# Patient Record
Sex: Female | Born: 1990 | Race: Black or African American | Hispanic: No | Marital: Single | Smoking: Never smoker
Health system: Southern US, Community
[De-identification: ages and names within clinical notes are randomized; demographics above are authoritative.]

## PROBLEM LIST (undated history)

## (undated) DIAGNOSIS — E282 Polycystic ovarian syndrome: Secondary | ICD-10-CM

## (undated) DIAGNOSIS — F32A Depression, unspecified: Secondary | ICD-10-CM

## (undated) DIAGNOSIS — F329 Major depressive disorder, single episode, unspecified: Secondary | ICD-10-CM

## (undated) HISTORY — DX: Depression, unspecified: F32.A

## (undated) HISTORY — DX: Polycystic ovarian syndrome: E28.2

---
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
## 2013-03-29 ENCOUNTER — Ambulatory Visit (INDEPENDENT_AMBULATORY_CARE_PROVIDER_SITE_OTHER): Payer: BC Managed Care – PPO | Admitting: Emergency Medicine

## 2013-03-29 VITALS — BP 108/62 | HR 116 | Temp 99.4°F | Resp 18 | Ht 61.0 in | Wt 165.0 lb

## 2013-03-29 DIAGNOSIS — M940 Chondrocostal junction syndrome [Tietze]: Secondary | ICD-10-CM

## 2013-03-29 MED ORDER — TRAMADOL HCL 50 MG PO TABS: 50.0000 mg | ORAL_TABLET | Freq: Three times a day (TID) | ORAL | Status: DC | PRN

## 2013-03-29 MED ORDER — NAPROXEN SODIUM 550 MG PO TABS: 550.0000 mg | ORAL_TABLET | Freq: Two times a day (BID) | ORAL | Status: AC

## 2013-03-29 NOTE — Progress Notes (Signed)
[facility] [address], [place] [address] [id number]  Date:  03/29/2013   Name:  Leslie Bell   DOB:  1990/06/01   MRN:  [id number]  PCP:  No primary provider on file.    Chief Complaint: pain under breast x2 hrs   History of Present Illness:  Leslie Bell is a 22 y.o. very pleasant female patient who presents with the following:  Ho history of injury.  Has sudden onset of sharp pain in lower left sternal region worse with deep breathing or movement.  No PE / DVT risks, no cough or coryza, waterbrash, no nausea or vomiting, no wheezing or shortness of breath.  No antecedent illness or overuse.  History of prior costochondritis..  No improvement with over the counter medications or other home remedies. Denies other complaint or health concern today.   There are no active problems to display for this patient.   History reviewed. No pertinent past medical history.  History reviewed. No pertinent past surgical history.  History  Substance Use Topics  . Smoking status: Never Smoker   . Smokeless tobacco: Not on file  . Alcohol Use: Not on file    Family History  Problem Relation Age of Onset  . Hypertension Mother   . Diabetes Maternal Grandmother   . Stroke Maternal Grandfather     No Known Allergies  Medication list has been reviewed and updated.  No current outpatient prescriptions on file prior to visit.   No current facility-administered medications on file prior to visit.    Review of Systems:  As per HPI, otherwise negative.    Physical Examination: Filed Vitals:   03/29/13 1646  BP: 108/62  Pulse: 116  Temp: 99.4 F (37.4 C)  Resp: 18   Filed Vitals:   03/29/13 1646  Height: 5\' 1"  (1.549 m)  Weight: 165 lb (74.844 kg)   Body mass index is 31.19 kg/(m^2). Ideal Body Weight: Weight in (lb) to have BMI = 25: 132  GEN: WDWN, NAD, Non-toxic, A & O x 3 HEENT: Atraumatic, Normocephalic. Neck supple. No masses, No  LAD. Ears and Nose: No external deformity. CV: RRR, No M/G/R. No JVD. No thrill. No extra heart sounds. PULM: CTA B, no wheezes, crackles, rhonchi. No retractions. No resp. distress. No accessory muscle use. Chest:  Left sternal border marked point tenderness inferior two ribs on left. ABD: S, NT, ND, +BS. No rebound. No HSM. EXTR: No c/c/e NEURO Normal gait.  PSYCH: Normally interactive. Conversant. Not depressed or anxious appearing.  Calm demeanor.    Assessment and Plan: Costochondritis Anaprox Tramadol   Signed,  Phillips Odor, MD

## 2013-03-29 NOTE — Patient Instructions (Signed)
costCostochondritis Costochondritis (Tietze syndrome), or costochondral separation, is a swelling and irritation (inflammation) of the tissue (cartilage) that connects your ribs with your breastbone (sternum). It may occur on its own (spontaneously), through damage caused by an accident (trauma), or simply from coughing or minor exercise. It may take up to 6 weeks to get better and longer if you are unable to be conservative in your activities. HOME CARE INSTRUCTIONS   Avoid exhausting physical activity. Try not to strain your ribs during normal activity. This would include any activities using chest, belly (abdominal), and side muscles, especially if heavy weights are used.  Use ice for 15-20 minutes per hour while awake for the first 2 days. Place the ice in a plastic bag, and place a towel between the bag of ice and your skin.  Only take over-the-counter or prescription medicines for pain, discomfort, or fever as directed by your caregiver. SEEK IMMEDIATE MEDICAL CARE IF:   Your pain increases or you are very uncomfortable.  You have a fever.  You develop difficulty with your breathing.  You cough up blood.  You develop worse chest pains, shortness of breath, sweating, or vomiting.  You develop new, unexplained problems (symptoms). MAKE SURE YOU:   Understand these instructions.  Will watch your condition.  Will get help right away if you are not doing well or get worse. Document Released: 02/02/2005 Document Revised: 07/18/2011 Document Reviewed: 11/27/2012 [facility] Patient Information 2014 [place], [place].

## 2018-01-03 ENCOUNTER — Other Ambulatory Visit: Payer: Self-pay | Admitting: Obstetrics and Gynecology

## 2018-01-03 ENCOUNTER — Other Ambulatory Visit (HOSPITAL_COMMUNITY)
Admission: RE | Admit: 2018-01-03 | Discharge: 2018-01-03 | Disposition: A | Discharge status: Home / Self Care | Payer: Managed Care, Other (non HMO) | Source: Ambulatory Visit | Attending: Obstetrics and Gynecology | Admitting: Obstetrics and Gynecology

## 2018-01-03 DIAGNOSIS — Z01419 Encounter for gynecological examination (general) (routine) without abnormal findings: Secondary | ICD-10-CM | POA: Insufficient documentation

## 2018-01-09 LAB — CYTOLOGY - PAP: Diagnosis: NEGATIVE

## 2018-02-27 DIAGNOSIS — E559 Vitamin D deficiency, unspecified: Secondary | ICD-10-CM | POA: Diagnosis not present

## 2018-02-27 DIAGNOSIS — Z0000 Encounter for general adult medical examination without abnormal findings: Secondary | ICD-10-CM | POA: Diagnosis not present

## 2018-03-01 DIAGNOSIS — N898 Other specified noninflammatory disorders of vagina: Secondary | ICD-10-CM | POA: Diagnosis not present

## 2018-03-01 DIAGNOSIS — Z3009 Encounter for other general counseling and advice on contraception: Secondary | ICD-10-CM | POA: Diagnosis not present

## 2018-03-01 DIAGNOSIS — Z113 Encounter for screening for infections with a predominantly sexual mode of transmission: Secondary | ICD-10-CM | POA: Diagnosis not present

## 2018-04-04 DIAGNOSIS — N926 Irregular menstruation, unspecified: Secondary | ICD-10-CM | POA: Diagnosis not present

## 2018-04-04 DIAGNOSIS — Z7251 High risk heterosexual behavior: Secondary | ICD-10-CM | POA: Diagnosis not present

## 2018-04-04 DIAGNOSIS — N3000 Acute cystitis without hematuria: Secondary | ICD-10-CM | POA: Diagnosis not present

## 2018-04-24 ENCOUNTER — Other Ambulatory Visit (HOSPITAL_COMMUNITY): Payer: Self-pay | Admitting: General Surgery

## 2018-05-04 ENCOUNTER — Ambulatory Visit (HOSPITAL_COMMUNITY): Payer: BLUE CROSS/BLUE SHIELD

## 2018-05-04 ENCOUNTER — Encounter (HOSPITAL_COMMUNITY): Payer: Self-pay

## 2018-05-04 ENCOUNTER — Ambulatory Visit (HOSPITAL_COMMUNITY): Admission: RE | Admit: 2018-05-04 | Payer: BLUE CROSS/BLUE SHIELD | Source: Ambulatory Visit

## 2018-05-04 ENCOUNTER — Inpatient Hospital Stay (HOSPITAL_COMMUNITY): Admission: RE | Admit: 2018-05-04 | Payer: BLUE CROSS/BLUE SHIELD | Source: Ambulatory Visit

## 2018-05-14 ENCOUNTER — Ambulatory Visit: Payer: BLUE CROSS/BLUE SHIELD | Admitting: Skilled Nursing Facility1

## 2018-05-23 ENCOUNTER — Other Ambulatory Visit (HOSPITAL_COMMUNITY): Payer: BLUE CROSS/BLUE SHIELD

## 2018-05-23 ENCOUNTER — Ambulatory Visit (HOSPITAL_COMMUNITY): Admission: RE | Admit: 2018-05-23 | Payer: BLUE CROSS/BLUE SHIELD | Source: Ambulatory Visit

## 2018-05-28 ENCOUNTER — Ambulatory Visit: Payer: BLUE CROSS/BLUE SHIELD | Admitting: Skilled Nursing Facility1

## 2018-06-04 DIAGNOSIS — F330 Major depressive disorder, recurrent, mild: Secondary | ICD-10-CM | POA: Diagnosis not present

## 2018-06-04 DIAGNOSIS — E559 Vitamin D deficiency, unspecified: Secondary | ICD-10-CM | POA: Diagnosis not present

## 2018-06-04 DIAGNOSIS — Z202 Contact with and (suspected) exposure to infections with a predominantly sexual mode of transmission: Secondary | ICD-10-CM | POA: Diagnosis not present

## 2018-06-04 DIAGNOSIS — E782 Mixed hyperlipidemia: Secondary | ICD-10-CM | POA: Diagnosis not present

## 2018-06-04 DIAGNOSIS — R5383 Other fatigue: Secondary | ICD-10-CM | POA: Diagnosis not present

## 2018-06-12 DIAGNOSIS — E782 Mixed hyperlipidemia: Secondary | ICD-10-CM | POA: Diagnosis not present

## 2018-07-25 DIAGNOSIS — Z3041 Encounter for surveillance of contraceptive pills: Secondary | ICD-10-CM | POA: Diagnosis not present

## 2018-07-25 DIAGNOSIS — E282 Polycystic ovarian syndrome: Secondary | ICD-10-CM | POA: Diagnosis not present

## 2018-07-25 DIAGNOSIS — N921 Excessive and frequent menstruation with irregular cycle: Secondary | ICD-10-CM | POA: Diagnosis not present

## 2019-03-01 ENCOUNTER — Telehealth: Payer: Self-pay | Admitting: Nurse Practitioner

## 2019-03-01 NOTE — Telephone Encounter (Signed)

## 2019-03-04 ENCOUNTER — Other Ambulatory Visit: Payer: Self-pay

## 2019-03-04 ENCOUNTER — Ambulatory Visit (INDEPENDENT_AMBULATORY_CARE_PROVIDER_SITE_OTHER): Payer: 59 | Admitting: Nurse Practitioner

## 2019-03-04 ENCOUNTER — Encounter: Payer: Self-pay | Admitting: Nurse Practitioner

## 2019-03-04 VITALS — BP 116/86 | HR 92 | Temp 98.0°F | Ht 61.0 in | Wt 236.2 lb

## 2019-03-04 DIAGNOSIS — Z8742 Personal history of other diseases of the female genital tract: Secondary | ICD-10-CM

## 2019-03-04 DIAGNOSIS — Z8659 Personal history of other mental and behavioral disorders: Secondary | ICD-10-CM | POA: Diagnosis not present

## 2019-03-04 DIAGNOSIS — Z8639 Personal history of other endocrine, nutritional and metabolic disease: Secondary | ICD-10-CM

## 2019-03-04 DIAGNOSIS — R7303 Prediabetes: Secondary | ICD-10-CM | POA: Diagnosis not present

## 2019-03-04 LAB — CBC
HCT: 40.3 % (ref 36.0–46.0)
Hemoglobin: 13.1 g/dL (ref 12.0–15.0)
MCHC: 32.6 g/dL (ref 30.0–36.0)
MCV: 88.9 fl (ref 78.0–100.0)
Platelets: 429 10*3/uL — ABNORMAL HIGH (ref 150.0–400.0)
RBC: 4.53 Mil/uL (ref 3.87–5.11)
RDW: 13.5 % (ref 11.5–15.5)
WBC: 9.7 10*3/uL (ref 4.0–10.5)

## 2019-03-04 LAB — COMPREHENSIVE METABOLIC PANEL
ALT: 18 U/L (ref 0–35)
AST: 21 U/L (ref 0–37)
Albumin: 4.3 g/dL (ref 3.5–5.2)
Alkaline Phosphatase: 56 U/L (ref 39–117)
BUN: 9 mg/dL (ref 6–23)
CO2: 25 mEq/L (ref 19–32)
Calcium: 9.5 mg/dL (ref 8.4–10.5)
Chloride: 104 mEq/L (ref 96–112)
Creatinine, Ser: 0.66 mg/dL (ref 0.40–1.20)
GFR: 131.11 mL/min (ref >60.00)
Glucose, Bld: 79 mg/dL (ref 70–99)
Potassium: 3.8 mEq/L (ref 3.5–5.1)
Sodium: 139 mEq/L (ref 135–145)
Total Bilirubin: 0.3 mg/dL (ref 0.2–1.2)
Total Protein: 7.6 g/dL (ref 6.0–8.3)

## 2019-03-04 LAB — TSH: TSH: 1.63 u[IU]/mL (ref 0.35–4.50)

## 2019-03-04 LAB — HEMOGLOBIN A1C: Hgb A1c MFr Bld: 6.2 % (ref 4.6–6.5)

## 2019-03-04 MED ORDER — VENLAFAXINE HCL 75 MG PO TABS: 75.0000 mg | ORAL_TABLET | Freq: Two times a day (BID) | ORAL | 5 refills | Status: DC

## 2019-03-04 NOTE — Patient Instructions (Addendum)
.  Normal CMP, TSH, cbc HgbA1c indicates prediabetes at 6.2 Continue metformin dose. F/up in 71months  Sign medical release to get records from previous pcp and GYN in [place], and [place].

## 2019-03-04 NOTE — Progress Notes (Signed)
Subjective:  Patient ID: Leslie Bell, female    DOB: July 05, 1990  Age: 28 y.o. MRN: [id number]  CC: Establish Care (est care--effexor and metformin 100 mg and 500 mg daily)  Anxiety Presents for initial visit. Onset was 1 to 5 years ago. The problem has been gradually improving. Symptoms include insomnia. Patient reports no decreased concentration, depressed mood, excessive worry, hyperventilation, impotence, irritability, malaise, muscle tension, nausea, nervous/anxious behavior, panic, restlessness, shortness of breath or suicidal ideas. Symptoms occur occasionally. The severity of symptoms is mild. The symptoms are aggravated by social activities. The quality of sleep is fair.   There are no known risk factors.  Her past medical history is significant for anxiety/panic attacks and depression. There is no history of anemia, asthma, bipolar disorder, fibromyalgia, hyperthyroidism or suicide attempts. Past treatments include non-SSRI antidepressants. The treatment provided significant relief. Compliance with prior treatments has been good.   Reports hx of PCOS and metabolic Syndrome: Diagnosed 77yrs ago. Current use of OCP and metformin. Last pelvic US done by GYN.  Reviewed past Medical, Social and Family history today.  Outpatient Medications Prior to Visit  Medication Sig Dispense Refill  . Levonorgest-Eth Estrad 91-Day (CAMRESE PO) Take by mouth.    . metFORMIN (GLUCOPHAGE) 1000 MG tablet Take 1,000 mg by mouth 2 (two) times daily with a meal.    . metFORMIN (GLUCOPHAGE) 500 MG tablet Take by mouth 2 (two) times daily with a meal.    . venlafaxine (EFFEXOR) 75 MG tablet Take 75 mg by mouth 2 (two) times daily.    . norgestrel-ethinyl estradiol (LO/OVRAL,CRYSELLE) 0.3-30 MG-MCG tablet Take 1 tablet by mouth daily.    . traMADol (ULTRAM) 50 MG tablet Take 1 tablet (50 mg total) by mouth every 8 (eight) hours as needed. (Patient not taking: Reported on 03/04/2019) 30 tablet 0   No  facility-administered medications prior to visit.     ROS See HPI  Objective:  BP 116/86   Pulse 92   Temp 98 F (36.7 C) (Tympanic)   Ht 5\' 1"  (1.549 m)   Wt 236 lb 3.2 oz (107.1 kg)   SpO2 98%   BMI 44.63 kg/m   BP Readings from Last 3 Encounters:  03/04/19 116/86  03/29/13 108/62    Wt Readings from Last 3 Encounters:  03/04/19 236 lb 3.2 oz (107.1 kg)  03/29/13 165 lb (74.8 kg) (90 %, Z= 1.26)*   * Growth percentiles are based on CDC (Girls, 2-20 Years) data.    Physical Exam Constitutional:      Appearance: She is obese.  Neck:     Musculoskeletal: Normal range of motion and neck supple.     Thyroid: No thyroid mass, thyromegaly or thyroid tenderness.  Cardiovascular:     Rate and Rhythm: Normal rate and regular rhythm.     Pulses: Normal pulses.     Heart sounds: Normal heart sounds.  Pulmonary:     Effort: Pulmonary effort is normal.     Breath sounds: Normal breath sounds.  Lymphadenopathy:     Cervical: No cervical adenopathy.  Neurological:     Mental Status: She is alert and oriented to person, place, and time.  Psychiatric:        Mood and Affect: Mood normal.        Behavior: Behavior normal.        Thought Content: Thought content normal.    Lab Results  Component Value Date   WBC 9.7 03/04/2019   HGB 13.1 03/04/2019  HCT 40.3 03/04/2019   PLT 429.0 (H) 03/04/2019   GLUCOSE 79 03/04/2019   ALT 18 03/04/2019   AST 21 03/04/2019   NA 139 03/04/2019   K 3.8 03/04/2019   CL 104 03/04/2019   CREATININE 0.66 03/04/2019   BUN 9 03/04/2019   CO2 25 03/04/2019   TSH 1.63 03/04/2019   HGBA1C 6.2 03/04/2019     Assessment & Plan:   Leslie Bell was seen today for establish care.  Diagnoses and all orders for this visit:  History of metabolic syndrome -     CBC -     Comprehensive metabolic panel -     Hemoglobin A1c -     TSH -     metFORMIN (GLUCOPHAGE) 1000 MG tablet; Take 1 tablet (1,000 mg total) by mouth daily with breakfast. In  combination with 500mg  dose -     metFORMIN (GLUCOPHAGE) 500 MG tablet; Take 1 tablet (500 mg total) by mouth daily with breakfast. In combination with 1000mg  dose  History of PCOS  History of anxiety disorder -     Discontinue: venlafaxine (EFFEXOR) 75 MG tablet; Take 1 tablet (75 mg total) by mouth 2 (two) times daily with a meal. -     venlafaxine (EFFEXOR) 75 MG tablet; Take 1 tablet (75 mg total) by mouth 2 (two) times daily with a meal.  Prediabetes -     metFORMIN (GLUCOPHAGE) 1000 MG tablet; Take 1 tablet (1,000 mg total) by mouth daily with breakfast. In combination with 500mg  dose -     metFORMIN (GLUCOPHAGE) 500 MG tablet; Take 1 tablet (500 mg total) by mouth daily with breakfast. In combination with 1000mg  dose   I have discontinued Leslie Bell's traMADol. I have also changed her metFORMIN and metFORMIN. Additionally, I am having her maintain her norgestrel-ethinyl estradiol, Levonorgest-Eth Estrad 91-Day (CAMRESE PO), and venlafaxine.  Meds ordered this encounter  Medications  . DISCONTD: venlafaxine (EFFEXOR) 75 MG tablet    Sig: Take 1 tablet (75 mg total) by mouth 2 (two) times daily with a meal.    Dispense:  60 tablet    Refill:  5    Order Specific Question:   Supervising Provider    Answer:   Lucille Passy [[id number]]  . venlafaxine (EFFEXOR) 75 MG tablet    Sig: Take 1 tablet (75 mg total) by mouth 2 (two) times daily with a meal.    Dispense:  60 tablet    Refill:  5  . metFORMIN (GLUCOPHAGE) 1000 MG tablet    Sig: Take 1 tablet (1,000 mg total) by mouth daily with breakfast. In combination with 500mg  dose    Dispense:  90 tablet    Refill:  1    Order Specific Question:   Supervising Provider    Answer:   Lucille Passy [[id number]]  . metFORMIN (GLUCOPHAGE) 500 MG tablet    Sig: Take 1 tablet (500 mg total) by mouth daily with breakfast. In combination with 1000mg  dose    Dispense:  90 tablet    Refill:  1    Order Specific Question:   Supervising Provider     Answer:   Lucille Passy [[id number]]    Problem List Items Addressed This Visit    None    Visit Diagnoses    History of metabolic syndrome    -  Primary   Relevant Medications   metFORMIN (GLUCOPHAGE) 1000 MG tablet   metFORMIN (GLUCOPHAGE) 500 MG tablet   Other Relevant Orders  CBC (Completed)   Comprehensive metabolic panel (Completed)   Hemoglobin A1c (Completed)   TSH (Completed)   History of PCOS       History of anxiety disorder       Relevant Medications   venlafaxine (EFFEXOR) 75 MG tablet   Prediabetes       Relevant Medications   metFORMIN (GLUCOPHAGE) 1000 MG tablet   metFORMIN (GLUCOPHAGE) 500 MG tablet      Follow-up: Return in about 6 months (around 09/02/2019) for anxiety.  Alysia Pennaharlotte Abram Sax, NP

## 2019-03-05 MED ORDER — METFORMIN HCL 500 MG PO TABS: 500.0000 mg | ORAL_TABLET | Freq: Every day | ORAL | 1 refills | Status: DC

## 2019-03-05 MED ORDER — METFORMIN HCL 1000 MG PO TABS: 1000.0000 mg | ORAL_TABLET | Freq: Every day | ORAL | 1 refills | Status: DC

## 2019-03-11 ENCOUNTER — Encounter: Payer: Self-pay | Admitting: Nurse Practitioner

## 2019-03-11 DIAGNOSIS — E282 Polycystic ovarian syndrome: Secondary | ICD-10-CM | POA: Insufficient documentation

## 2019-03-19 ENCOUNTER — Encounter: Payer: Self-pay | Admitting: Nurse Practitioner

## 2019-03-19 DIAGNOSIS — F411 Generalized anxiety disorder: Secondary | ICD-10-CM | POA: Insufficient documentation

## 2019-03-19 DIAGNOSIS — F32A Depression, unspecified: Secondary | ICD-10-CM | POA: Insufficient documentation

## 2019-03-19 DIAGNOSIS — F339 Major depressive disorder, recurrent, unspecified: Secondary | ICD-10-CM | POA: Insufficient documentation

## 2019-03-19 DIAGNOSIS — E559 Vitamin D deficiency, unspecified: Secondary | ICD-10-CM | POA: Insufficient documentation

## 2019-03-19 DIAGNOSIS — F321 Major depressive disorder, single episode, moderate: Secondary | ICD-10-CM | POA: Insufficient documentation

## 2019-03-19 DIAGNOSIS — E049 Nontoxic goiter, unspecified: Secondary | ICD-10-CM | POA: Insufficient documentation

## 2019-04-29 ENCOUNTER — Telehealth: Payer: Self-pay | Admitting: Nurse Practitioner

## 2019-04-29 DIAGNOSIS — F411 Generalized anxiety disorder: Secondary | ICD-10-CM

## 2019-04-29 DIAGNOSIS — F321 Major depressive disorder, single episode, moderate: Secondary | ICD-10-CM

## 2019-04-29 NOTE — Telephone Encounter (Signed)
Pt is stating that this script is the wrong one and did not pick up, states that she takes the. XR at [facility] in [place]. States. 75. Mg once a day for extended. Please FU  [phone] venlafaxine (EFFEXOR) 75 MG tablet  [id number] [address], [place] - [place] Phone:  [phone]  Fax:  [phone]

## 2019-04-30 MED ORDER — VENLAFAXINE HCL ER 75 MG PO CP24: 75.0000 mg | ORAL_CAPSULE | Freq: Every day | ORAL | 1 refills | Status: DC

## 2019-04-30 NOTE — Telephone Encounter (Signed)
Pt stated she always take Venlafaxine XR 75 mg once daily. Pt wants to stay on this one, please advise. Pt stated she never pick up the one we sent in on 03/04/2019.

## 2019-04-30 NOTE — Telephone Encounter (Signed)
Dose changed and 75mg  XR sent

## 2019-04-30 NOTE — Telephone Encounter (Signed)
Pt is aware.  

## 2019-05-26 ENCOUNTER — Other Ambulatory Visit: Payer: Self-pay | Admitting: Nurse Practitioner

## 2019-05-26 DIAGNOSIS — F321 Major depressive disorder, single episode, moderate: Secondary | ICD-10-CM

## 2019-05-26 DIAGNOSIS — F411 Generalized anxiety disorder: Secondary | ICD-10-CM

## 2019-09-02 ENCOUNTER — Ambulatory Visit: Payer: Self-pay | Admitting: Nurse Practitioner

## 2019-09-02 ENCOUNTER — Other Ambulatory Visit: Payer: Self-pay | Admitting: Nurse Practitioner

## 2019-09-02 DIAGNOSIS — Z8639 Personal history of other endocrine, nutritional and metabolic disease: Secondary | ICD-10-CM

## 2019-09-02 DIAGNOSIS — R7303 Prediabetes: Secondary | ICD-10-CM

## 2019-09-02 DIAGNOSIS — Z0289 Encounter for other administrative examinations: Secondary | ICD-10-CM

## 2019-10-02 ENCOUNTER — Other Ambulatory Visit: Payer: Self-pay | Admitting: Nurse Practitioner

## 2019-10-02 DIAGNOSIS — F411 Generalized anxiety disorder: Secondary | ICD-10-CM

## 2019-10-02 DIAGNOSIS — F321 Major depressive disorder, single episode, moderate: Secondary | ICD-10-CM

## 2019-12-03 DIAGNOSIS — Z20822 Contact with and (suspected) exposure to covid-19: Secondary | ICD-10-CM | POA: Diagnosis not present

## 2019-12-03 DIAGNOSIS — Z03818 Encounter for observation for suspected exposure to other biological agents ruled out: Secondary | ICD-10-CM | POA: Diagnosis not present

## 2019-12-11 ENCOUNTER — Other Ambulatory Visit: Payer: Self-pay

## 2019-12-11 ENCOUNTER — Ambulatory Visit: Payer: Self-pay | Admitting: Nurse Practitioner

## 2019-12-12 ENCOUNTER — Ambulatory Visit (INDEPENDENT_AMBULATORY_CARE_PROVIDER_SITE_OTHER): Payer: BC Managed Care – PPO

## 2019-12-12 ENCOUNTER — Ambulatory Visit: Payer: BC Managed Care – PPO | Admitting: Nurse Practitioner

## 2019-12-12 ENCOUNTER — Encounter: Payer: Self-pay | Admitting: Nurse Practitioner

## 2019-12-12 VITALS — BP 104/64 | HR 92 | Temp 98.5°F | Ht 61.0 in | Wt 228.0 lb

## 2019-12-12 DIAGNOSIS — G8929 Other chronic pain: Secondary | ICD-10-CM | POA: Diagnosis not present

## 2019-12-12 DIAGNOSIS — M549 Dorsalgia, unspecified: Secondary | ICD-10-CM

## 2019-12-12 DIAGNOSIS — F411 Generalized anxiety disorder: Secondary | ICD-10-CM

## 2019-12-12 DIAGNOSIS — F321 Major depressive disorder, single episode, moderate: Secondary | ICD-10-CM

## 2019-12-12 DIAGNOSIS — R918 Other nonspecific abnormal finding of lung field: Secondary | ICD-10-CM | POA: Diagnosis not present

## 2019-12-12 DIAGNOSIS — M4184 Other forms of scoliosis, thoracic region: Secondary | ICD-10-CM | POA: Diagnosis not present

## 2019-12-12 DIAGNOSIS — E282 Polycystic ovarian syndrome: Secondary | ICD-10-CM | POA: Diagnosis not present

## 2019-12-12 MED ORDER — METFORMIN HCL ER 750 MG PO TB24: 750.0000 mg | ORAL_TABLET | Freq: Every day | ORAL | 1 refills | Status: DC

## 2019-12-12 MED ORDER — VENLAFAXINE HCL ER 75 MG PO CP24: 75.0000 mg | ORAL_CAPSULE | Freq: Every day | ORAL | 3 refills | Status: DC

## 2019-12-12 NOTE — Patient Instructions (Addendum)
No disc disease Scoliosis of spine-congenital Continue use of tylenol or ibuprofen for pain management. home back exercise, maintain proper posture and weight loss needed to manage pain.  Metformin changed to 750mg  XR.  Maintain effexor dose

## 2019-12-12 NOTE — Assessment & Plan Note (Addendum)
Stable with effexor GAD-7 score: 0 Continue medication

## 2019-12-12 NOTE — Assessment & Plan Note (Addendum)
Stable with effexor PHQ-9 score: 3 Continue current medication

## 2019-12-12 NOTE — Assessment & Plan Note (Signed)
Onset 1year ago, intermittent, worsening. No paresthesia, no weight loss, not radiating, no UE weakness.  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 home back exercise, maintain proper posture and weight loss needed to manage pain.

## 2019-12-12 NOTE — Progress Notes (Signed)
Subjective:  Patient ID: Leslie Bell, female    DOB: 1990-09-30  Age: 29 y.o. MRN: [id number]  CC: Metabolic Syndrome (Stopped the metformin due to GI issues. Asking about ER dosing.), Back Pain (Intermittent dull ache in mid back for about a year. No known injury.), and Depression  HPI  Mid back pain, chronic  Onset 1year ago, intermittent, worsening. No paresthesia, no weight loss, not radiating, no UE weakness.  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 home back exercise, maintain proper posture and weight loss needed to manage pain.   GAD (generalized anxiety disorder) Stable with effexor GAD-7 score: 0 Continue medication  Depression, major, single episode, moderate (HCC) Stable with effexor PHQ-9 score: 3 Continue current medication  PCOS (polycystic ovarian syndrome) Diarrhea with metformin 1500mg  Last Hgb A1c at 6.2 Changed dose to 750mg  ER F/up in 103months  Depression screen [facility] 2/9 12/12/2019 12/12/2019 03/04/2019  Decreased Interest 0 0 0  Down, Depressed, Hopeless 0 0 0  PHQ - 2 Score 0 0 0  Altered sleeping 0 - 1  Tired, decreased energy 2 - 2  Change in appetite 0 - 0  Feeling bad or failure about yourself  0 - 0  Trouble concentrating 1 - 0  Moving slowly or fidgety/restless 0 - 0  Suicidal thoughts 0 - 0  PHQ-9 Score 3 - 3  Difficult doing work/chores - - Somewhat difficult   GAD 7 : Generalized Anxiety Score 12/12/2019 03/04/2019  Nervous, Anxious, on Edge 0 0  Control/stop worrying 0 0  Worry too much - different things 0 0  Trouble relaxing 0 0  Restless 0 0  Easily annoyed or irritable 0 0  Afraid - awful might happen 0 0  Total GAD 7 Score 0 0  Anxiety Difficulty - Somewhat difficult   Reviewed past Medical, Social and Family history today.  Outpatient Medications Prior to Visit  Medication Sig Dispense Refill  . Levonorgest-Eth Estrad  91-Day (CAMRESE PO) Take by mouth.    . venlafaxine XR (EFFEXOR-XR) 75 MG 24 hr capsule Take 1 capsule (75 mg total) by mouth daily with breakfast. Need office visit for additional refills 90 capsule 0  . metFORMIN (GLUCOPHAGE) 1000 MG tablet Take 1 tablet (1,000 mg total) by mouth daily with breakfast. In combination with 500mg  dose 90 tablet 1  . metFORMIN (GLUCOPHAGE) 500 MG tablet Take 1 tablet (500 mg total) by mouth daily with breakfast. In combination with 1000mg  dose 90 tablet 1  . norgestrel-ethinyl estradiol (LO/OVRAL,CRYSELLE) 0.3-30 MG-MCG tablet Take 1 tablet by mouth daily.     No facility-administered medications prior to visit.    ROS See HPI  Objective:  BP 104/64 (BP Location: Left Arm, Patient Position: Sitting, Cuff Size: Large)   Pulse 92   Temp 98.5 F (36.9 C)   Ht 5\' 1"  (1.549 m)   Wt 228 lb (103.4 kg)   SpO2 99%   BMI 43.08 kg/m   Physical Exam Vitals reviewed.  Constitutional:      Appearance: She is obese.  Cardiovascular:     Rate and Rhythm: Normal rate and regular rhythm.  Musculoskeletal:        General: No tenderness or deformity.     Cervical back: Normal.     Thoracic back: Normal.     Lumbar back: Normal.     Right lower leg: No edema.     Left lower  leg: No edema.  Skin:    General: Skin is warm and dry.     Findings: No erythema or rash.  Neurological:     Mental Status: She is alert and oriented to person, place, and time.  Psychiatric:        Mood and Affect: Mood normal.        Behavior: Behavior normal.        Thought Content: Thought content normal.    Assessment & Plan:  This visit occurred during the SARS-CoV-2 public health emergency.  Safety protocols were in place, including screening questions prior to the visit, additional usage of staff PPE, and extensive cleaning of exam room while observing appropriate contact time as indicated for disinfecting solutions.   Arzu was seen today for metabolic syndrome, back pain and  depression.  Diagnoses and all orders for this visit:  PCOS (polycystic ovarian syndrome) -     metFORMIN (GLUCOPHAGE-XR) 750 MG 24 hr tablet; Take 1 tablet (750 mg total) by mouth daily with breakfast.  Depression, major, single episode, moderate (HCC) -     venlafaxine XR (EFFEXOR-XR) 75 MG 24 hr capsule; Take 1 capsule (75 mg total) by mouth daily with breakfast. Need office visit for additional refills  GAD (generalized anxiety disorder) -     venlafaxine XR (EFFEXOR-XR) 75 MG 24 hr capsule; Take 1 capsule (75 mg total) by mouth daily with breakfast. Need office visit for additional refills  Mid back pain, chronic -     Cancel: DG Thoracic Spine 4V -     DG Thoracic Spine W/Swimmers    Problem List Items Addressed This Visit      Endocrine   PCOS (polycystic ovarian syndrome) - Primary    Diarrhea with metformin 1500mg  Last Hgb A1c at 6.2 Changed dose to 750mg  ER F/up in 85months      Relevant Medications   metFORMIN (GLUCOPHAGE-XR) 750 MG 24 hr tablet     Other   Depression, major, single episode, moderate (HCC)    Stable with effexor PHQ-9 score: 3 Continue current medication      Relevant Medications   venlafaxine XR (EFFEXOR-XR) 75 MG 24 hr capsule   GAD (generalized anxiety disorder)    Stable with effexor GAD-7 score: 0 Continue medication      Relevant Medications   venlafaxine XR (EFFEXOR-XR) 75 MG 24 hr capsule   Mid back pain, chronic     Onset 1year ago, intermittent, worsening. No paresthesia, no weight loss, not radiating, no UE weakness.  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 home back exercise, maintain proper posture and weight loss needed to manage pain.       Relevant Medications   venlafaxine XR (EFFEXOR-XR) 75 MG 24 hr capsule   Other Relevant Orders   DG Thoracic Spine W/Swimmers (Completed)      Follow-up: Return in about 3  months (around 03/13/2020) for CPE (fasting).  1month, NP

## 2019-12-12 NOTE — Assessment & Plan Note (Signed)
Diarrhea with metformin 1500mg  Last Hgb A1c at 6.2 Changed dose to 750mg  ER F/up in 47months

## 2019-12-23 DIAGNOSIS — N921 Excessive and frequent menstruation with irregular cycle: Secondary | ICD-10-CM | POA: Diagnosis not present

## 2019-12-23 DIAGNOSIS — E282 Polycystic ovarian syndrome: Secondary | ICD-10-CM | POA: Diagnosis not present

## 2020-04-21 ENCOUNTER — Encounter: Payer: Self-pay | Admitting: Physician Assistant

## 2020-04-21 ENCOUNTER — Other Ambulatory Visit: Payer: Self-pay

## 2020-04-21 ENCOUNTER — Ambulatory Visit: Payer: BC Managed Care – PPO | Admitting: Physician Assistant

## 2020-04-21 VITALS — BP 110/78 | HR 88 | Temp 98.0°F | Ht 61.0 in | Wt 236.0 lb

## 2020-04-21 DIAGNOSIS — S6992XA Unspecified injury of left wrist, hand and finger(s), initial encounter: Secondary | ICD-10-CM

## 2020-04-21 NOTE — Patient Instructions (Signed)
Fingernail or Toenail Removal, Adult, Care After °This sheet gives you information about how to care for yourself after your procedure. Your health care provider may also give you more specific instructions. If you have problems or questions, contact your health care provider. °What can I expect after the procedure? °After the procedure, it is common to have: °· Pain. °· Redness. °· Swelling. °· Soreness. °Follow these instructions at home: °Medicines °· Take over-the-counter and prescription medicines only as told by your health care provider. °· If you were prescribed an antibiotic medicine, take or apply it as told by your health care provider. Do not stop using the antibiotic even if you start to feel better. °Wound care °· Follow instructions from your health care provider about how to take care of your wound. Make sure you: °? Wash your hands with soap and water for at least 20 seconds before and after you change your bandage (dressing). If soap and water are not available, use hand sanitizer. °? Change your dressing as told by your health care provider. °? Keep your dressing dry until your health care provider says it can be removed. °· Check your wound every day for signs of infection. Check for: °? More redness, swelling, or pain. °? More fluid or blood. °? Warmth. °? Pus or a bad smell. °If you have a splint: ° °· Wear the splint as told by your health care provider. Remove it only as told by your health care provider. °· Loosen the splint if your fingers tingle, become numb, or turn cold and blue. °· Keep the splint clean. °· If the splint is not waterproof: °? Do not let it get wet. °? Cover it with a watertight covering when you take a bath or a shower. °Managing pain, stiffness, and swelling °· Move your fingers or toes often to reduce stiffness and swelling. °· Raise (elevate) the injured area above the level of your heart while you are sitting or lying down. You may need to keep your hand or foot  raised or supported on a pillow for 24 hours or as told by your health care provider. °General instructions °· If you were given a shoe to wear, wear it as told by your health care provider. °· Keep all follow-up visits as told by your health care provider. This is important. °Contact a health care provider if: °· You have more redness, swelling, or pain around your wound. °· You have more fluid or blood coming from your wound. °· You have pus or a bad smell coming from your wound. °· Your wound feels warm to the touch. °· You have a fever. °Get help right away if: °· Your finger or toe looks pale, blue, or black. °· You are not able to move your finger or toe. °Summary °· After the procedure, it is common to have pain and swelling. °· Keep the hand or foot raised (elevated) or supported on a pillow as told by your health care provider. °· Take over-the-counter and prescription medicines only as told by your health care provider. °· Check your wound every day for signs of infection. °This information is not intended to replace advice given to you by your health care provider. Make sure you discuss any questions you have with your health care provider. °Document Revised: 12/17/2018 Document Reviewed: 12/17/2018 °Elsevier Patient Education © 2020 [employer] ° °

## 2020-04-21 NOTE — Progress Notes (Signed)
Leslie Bell is a 29 y.o. female here for a new problem.  I acted as a Neurosurgeon for [employer], PA-C Corky Mull, LPN   History of Present Illness:   Chief Complaint  Patient presents with  . Nail Problem    HPI   Nail problem Pt is here due to nail injury. On Saturday, her left thumb nail got caught on her leg and it pulled off all the way down to the base of the nail bed. She has significant pain with this. Has significant tenderness any time she touches this area. She cleaned it with saline solution immediately afterwards. She is having pain took tylenol.  Denies: fevers, chills, malaise, purulent discharge  Past Medical History:  Diagnosis Date  . Depression   . PCOS (polycystic ovarian syndrome)      Social History   Tobacco Use  . Smoking status: Never Smoker  . Smokeless tobacco: Never Used  Vaping Use  . Vaping Use: Never used  Substance Use Topics  . Alcohol use: Yes    Alcohol/week: 1.0 standard drink    Types: 1 Glasses of wine per week    Comment: once a week  . Drug use: Never    History reviewed. No pertinent surgical history.  Family History  Problem Relation Age of Onset  . Hypertension Mother   . Hypertension Father   . Alcohol abuse Father   . Diabetes Maternal Grandmother   . Kidney disease Maternal Grandmother   . Stroke Maternal Grandmother   . Stroke Maternal Grandfather   . Drug abuse Paternal Grandmother   . Heart disease Paternal Grandmother   . Cancer Paternal Grandfather 40       unknown    No Known Allergies  Current Medications:   Current Outpatient Medications:  .  Levonorgestrel-Ethinyl Estradiol (AMETHIA) 0.15-0.03 &0.01 MG tablet, Take 1 tablet by mouth daily., Disp: , Rfl:  .  metFORMIN (GLUCOPHAGE-XR) 750 MG 24 hr tablet, Take 1 tablet (750 mg total) by mouth daily with breakfast., Disp: 90 tablet, Rfl: 1 .  venlafaxine XR (EFFEXOR-XR) 75 MG 24 hr capsule, Take 1 capsule (75 mg total) by mouth daily with  breakfast. Need office visit for additional refills, Disp: 90 capsule, Rfl: 3   Review of Systems:   ROS  Negative unless otherwise specified per HPI.  Vitals:   Vitals:   04/21/20 1146  BP: 110/78  Pulse: 88  Temp: 98 F (36.7 C)  TempSrc: Temporal  Weight: 236 lb (107 kg)  Height: 5\' 1"  (1.549 m)     Body mass index is 44.59 kg/m.  Physical Exam:   Physical Exam Constitutional:      Appearance: She is well-developed and well-nourished.  HENT:     Head: Normocephalic and atraumatic.  Eyes:     Extraocular Movements: EOM normal.     Conjunctiva/sclera: Conjunctivae normal.  Pulmonary:     Effort: Pulmonary effort is normal.  Musculoskeletal:        General: Normal range of motion.     Cervical back: Normal range of motion and neck supple.  Skin:    General: Skin is warm and dry.     Comments: Left thumbnail with significant TTP. Difficult to assess due to severity of pain. Acrylic nail glued to patient's fingernail.  Neurological:     Mental Status: She is alert and oriented to person, place, and time.  Psychiatric:        Mood and Affect: Mood and affect normal.  Behavior: Behavior normal.        Thought Content: Thought content normal.        Judgment: Judgment normal.     L thumbnail removal Informed consent is obtained. Using 1% plain lidocaine, a ring block was done (6 cc total). Multiple attempts at digital block was attempted, however medial portion of thumb remained mostly sensitive. Additional 1% lidocaine was topically placed onto the nail bed and 1 cc was then injected into the nail bed. Using sterile instrument hemostat, I freed the nail from the nail bed and removed the entire thumb nail and artificial nail. This was well tolerated, minimal bleeding. Antibiotic ointment and a dressing were applied.   Assessment and Plan:   Leslie Bell was seen today for nail problem.  Diagnoses and all orders for this visit:  Injury of left thumbnail, initial  encounter   Thumbnail was removed without any concerning issues. Topical bacitracin was applied and area was bandaged. Post-procedure information provided to patient.  CMA or LPN served as scribe during this visit. History, Physical, and Plan performed by medical provider. The above documentation has been reviewed and is accurate and complete.  Jarold Motto, PA-C

## 2020-08-09 ENCOUNTER — Emergency Department (HOSPITAL_COMMUNITY): Payer: 59

## 2020-08-09 ENCOUNTER — Emergency Department (HOSPITAL_COMMUNITY)
Admission: EM | Admit: 2020-08-09 | Discharge: 2020-08-09 | Disposition: A | Discharge status: Home / Self Care | Payer: 59 | Attending: Emergency Medicine | Admitting: Emergency Medicine

## 2020-08-09 ENCOUNTER — Encounter (HOSPITAL_COMMUNITY): Payer: Self-pay | Admitting: Emergency Medicine

## 2020-08-09 ENCOUNTER — Other Ambulatory Visit: Payer: Self-pay

## 2020-08-09 DIAGNOSIS — Z7984 Long term (current) use of oral hypoglycemic drugs: Secondary | ICD-10-CM | POA: Insufficient documentation

## 2020-08-09 DIAGNOSIS — K8050 Calculus of bile duct without cholangitis or cholecystitis without obstruction: Secondary | ICD-10-CM | POA: Insufficient documentation

## 2020-08-09 DIAGNOSIS — Z20822 Contact with and (suspected) exposure to covid-19: Secondary | ICD-10-CM | POA: Diagnosis not present

## 2020-08-09 DIAGNOSIS — R112 Nausea with vomiting, unspecified: Secondary | ICD-10-CM

## 2020-08-09 DIAGNOSIS — R1013 Epigastric pain: Secondary | ICD-10-CM | POA: Diagnosis present

## 2020-08-09 DIAGNOSIS — E119 Type 2 diabetes mellitus without complications: Secondary | ICD-10-CM | POA: Diagnosis not present

## 2020-08-09 LAB — RESP PANEL BY RT-PCR (FLU A&B, COVID) ARPGX2
Influenza A by PCR: NEGATIVE
Influenza B by PCR: NEGATIVE
SARS Coronavirus 2 by RT PCR: NEGATIVE

## 2020-08-09 LAB — COMPREHENSIVE METABOLIC PANEL
ALT: 13 U/L (ref 0–44)
AST: 13 U/L — ABNORMAL LOW (ref 15–41)
Albumin: 3.8 g/dL (ref 3.5–5.0)
Alkaline Phosphatase: 46 U/L (ref 38–126)
Anion gap: 9 (ref 5–15)
BUN: 8 mg/dL (ref 6–20)
CO2: 25 mmol/L (ref 22–32)
Calcium: 8.8 mg/dL — ABNORMAL LOW (ref 8.9–10.3)
Chloride: 104 mmol/L (ref 98–111)
Creatinine, Ser: 0.64 mg/dL (ref 0.44–1.00)
GFR, Estimated: >60 mL/min (ref >60)
Glucose, Bld: 137 mg/dL — ABNORMAL HIGH (ref 70–99)
Potassium: 4.1 mmol/L (ref 3.5–5.1)
Sodium: 138 mmol/L (ref 135–145)
Total Bilirubin: 0.5 mg/dL (ref 0.3–1.2)
Total Protein: 7.6 g/dL (ref 6.5–8.1)

## 2020-08-09 LAB — CBC WITH DIFFERENTIAL/PLATELET
Abs Immature Granulocytes: 0.1 10*3/uL — ABNORMAL HIGH (ref 0.00–0.07)
Basophils Absolute: 0 10*3/uL (ref 0.0–0.1)
Basophils Relative: 0 %
Eosinophils Absolute: 0 10*3/uL (ref 0.0–0.5)
Eosinophils Relative: 0 %
HCT: 39 % (ref 36.0–46.0)
Hemoglobin: 12.5 g/dL (ref 12.0–15.0)
Immature Granulocytes: 1 %
Lymphocytes Relative: 20 %
Lymphs Abs: 2.1 10*3/uL (ref 0.7–4.0)
MCH: 29 pg (ref 26.0–34.0)
MCHC: 32.1 g/dL (ref 30.0–36.0)
MCV: 90.5 fL (ref 80.0–100.0)
Monocytes Absolute: 0.6 10*3/uL (ref 0.1–1.0)
Monocytes Relative: 6 %
Neutro Abs: 7.6 10*3/uL (ref 1.7–7.7)
Neutrophils Relative %: 73 %
Platelets: 421 10*3/uL — ABNORMAL HIGH (ref 150–400)
RBC: 4.31 MIL/uL (ref 3.87–5.11)
RDW: 13 % (ref 11.5–15.5)
WBC: 10.5 10*3/uL (ref 4.0–10.5)
nRBC: 0 % (ref 0.0–0.2)

## 2020-08-09 LAB — I-STAT BETA HCG BLOOD, ED (MC, WL, AP ONLY): I-stat hCG, quantitative: <5 m[IU]/mL (ref <5)

## 2020-08-09 LAB — LIPASE, BLOOD: Lipase: 30 U/L (ref 11–51)

## 2020-08-09 MED ORDER — MORPHINE SULFATE (PF) 4 MG/ML IV SOLN
4.0000 mg | Freq: Once | INTRAVENOUS | Status: AC
  Administered 2020-08-09: 4 mg via INTRAVENOUS
  Filled 2020-08-09: qty 1

## 2020-08-09 MED ORDER — ONDANSETRON HCL 4 MG/2ML IJ SOLN
4.0000 mg | Freq: Once | INTRAMUSCULAR | Status: AC
  Administered 2020-08-09: 4 mg via INTRAVENOUS
  Filled 2020-08-09: qty 2

## 2020-08-09 NOTE — Discharge Instructions (Addendum)
Please make an appointment with a general surgeon in the office at the number above.  You may be suffering from biliary colic.  We talked about the possibility that there is another issue going on that caused your pain.  This could include a small kidney stone or a ruptured cyst on your ovaries.  We are not able to diagnosis at this time.  These are not life-threatening conditions.  However, if your pain returns and worsens, or you have severe vomiting, or you begin having this pain with a fever, you should return to the ER immediately.

## 2020-08-09 NOTE — ED Triage Notes (Signed)
Pt reports abdominal pain all night. States that she thought it was trapped gas, but she hasn't had any relief from tums, simethicone, stretching or BMs. Denies vomiting or diarhea.

## 2020-08-09 NOTE — ED Notes (Signed)
Patient given water

## 2020-08-09 NOTE — ED Provider Notes (Signed)
[facility]-EMERGENCY DEPT Provider Note   CSN: [id number] Arrival date & time: 08/09/20  4782     History Chief Complaint  Patient presents with  . Abdominal Pain    Leslie Bell is a 30 y.o. female presenting to emergency department with complaint of abdominal pain.  The patient is a history of PCOS and obesity, as well as diabetes.  She is also on birth control.  She reports that around 2 AM last night she woke up from sleep with pain in her epigastric and right upper quadrant.  She said it felt like trapped gas she felt bloated.  She was nauseated but did not vomit.  She took some Tums Methasone, no relief.  She did have a bowel movement was normal.  She says she has not had this kind of pain before.  She denies any history of abdominal surgeries.  She denies any lower pelvic pain.  She denies any fevers or chills.  Her last meal was yesterday evening around 8 PM.  HPI     Past Medical History:  Diagnosis Date  . Depression   . PCOS (polycystic ovarian syndrome)     Patient Active Problem List   Diagnosis Date Noted  . Mid back pain, chronic 12/12/2019  . Vitamin D deficiency 03/19/2019  . Depression, major, single episode, moderate (HCC) 03/19/2019  . GAD (generalized anxiety disorder) 03/19/2019  . Goiter 03/19/2019  . PCOS (polycystic ovarian syndrome) 03/11/2019    History reviewed. No pertinent surgical history.   OB History   No obstetric history on file.     Family History  Problem Relation Age of Onset  . Hypertension Mother   . Hypertension Father   . Alcohol abuse Father   . Diabetes Maternal Grandmother   . Kidney disease Maternal Grandmother   . Stroke Maternal Grandmother   . Stroke Maternal Grandfather   . Drug abuse Paternal Grandmother   . Heart disease Paternal Grandmother   . Cancer Paternal Grandfather 8       unknown    Social History   Tobacco Use  . Smoking status: Never Smoker  . Smokeless tobacco: Never  Used  Vaping Use  . Vaping Use: Never used  Substance Use Topics  . Alcohol use: Yes    Alcohol/week: 1.0 standard drink    Types: 1 Glasses of wine per week    Comment: once a week  . Drug use: Never    Home Medications Prior to Admission medications   Medication Sig Start Date End Date Taking? Authorizing Provider  Levonorgestrel-Ethinyl Estradiol (AMETHIA) 0.15-0.03 &0.01 MG tablet Take 1 tablet by mouth daily. 01/09/20   [provider]  metFORMIN (GLUCOPHAGE-XR) 750 MG 24 hr tablet Take 1 tablet (750 mg total) by mouth daily with breakfast. 12/12/19   Nche, Bonna Gains, NP  venlafaxine XR (EFFEXOR-XR) 75 MG 24 hr capsule Take 1 capsule (75 mg total) by mouth daily with breakfast. Need office visit for additional refills 12/12/19   Nche, Bonna Gains, NP    Allergies    Patient has no known allergies.  Review of Systems   Review of Systems  Constitutional: Negative for chills and fever.  HENT: Negative for ear pain and sore throat.   Eyes: Negative for pain and visual disturbance.  Respiratory: Negative for cough and shortness of breath.   Cardiovascular: Negative for chest pain and palpitations.  Gastrointestinal: Positive for abdominal pain, nausea and vomiting. Negative for constipation and diarrhea.  Genitourinary: Negative  for dysuria and pelvic pain.  Musculoskeletal: Negative for arthralgias and back pain.  Skin: Negative for color change and rash.  Neurological: Negative for syncope and light-headedness.  All other systems reviewed and are negative.   Physical Exam Updated Vital Signs BP (!) 147/81   Pulse 97   Temp 98.3 F (36.8 C) (Oral)   Resp 16   SpO2 97%   Physical Exam Constitutional:      General: She is in acute distress.     Appearance: She is obese.  HENT:     Head: Normocephalic and atraumatic.  Eyes:     Conjunctiva/sclera: Conjunctivae normal.     Pupils: Pupils are equal, round, and reactive to light.  Cardiovascular:     Rate  and Rhythm: Normal rate and regular rhythm.  Pulmonary:     Effort: Pulmonary effort is normal. No respiratory distress.  Abdominal:     General: There is no distension.     Tenderness: There is abdominal tenderness in the right upper quadrant and epigastric area. Positive signs include Murphy's sign. Negative signs include Rovsing's sign, McBurney's sign and psoas sign.  Skin:    General: Skin is warm and dry.  Neurological:     General: No focal deficit present.     Mental Status: She is alert. Mental status is at baseline.  Psychiatric:        Mood and Affect: Mood normal.        Behavior: Behavior normal.     ED Results / Procedures / Treatments   Labs (all labs ordered are listed, but only abnormal results are displayed) Labs Reviewed  COMPREHENSIVE METABOLIC PANEL - Abnormal; Notable for the following components:      Result Value   Glucose, Bld 137 (*)    Calcium 8.8 (*)    AST 13 (*)    All other components within normal limits  CBC WITH DIFFERENTIAL/PLATELET - Abnormal; Notable for the following components:   Platelets 421 (*)    Abs Immature Granulocytes 0.10 (*)    All other components within normal limits  RESP PANEL BY RT-PCR (FLU A&B, COVID) ARPGX2  LIPASE, BLOOD  I-STAT BETA HCG BLOOD, ED ([facility], [facility], [facility] ONLY)    EKG None  Radiology US Abdomen Limited RUQ (LIVER/GB)  Result Date: 08/09/2020 CLINICAL DATA:  Epigastric pain with nausea since last night. EXAM: ULTRASOUND ABDOMEN LIMITED RIGHT UPPER QUADRANT COMPARISON:  None. FINDINGS: Gallbladder: Mild prominence of the gallbladder without evidence of wall thickening. There is cholelithiasis with largest stone measuring 1.2 cm. Negative sonographic Murphy sign. No adjacent free fluid. Common bile duct: Diameter: 3 mm. Liver: No focal lesion identified. Within normal limits in parenchymal echogenicity. Portal vein is patent on color Doppler imaging with normal direction of blood flow towards the liver. Other: None.  IMPRESSION: Evidence of cholelithiasis without additional sonographic evidence of cholecystitis. Electronically Signed   By: Elberta Fortisaniel  Boyle M.D.   On: 08/09/2020 09:00    Procedures Ultrasound ED Abd  Date/Time: 08/09/2020 8:47 AM Performed by: Terald Sleeperrifan, Jahmez Bily J, MD Authorized by: Terald Sleeperrifan, Neave Lenger J, MD   Procedure details:    Indications: abdominal pain     Assessment for:  Gallstones   Hepatobiliary:  Visualized       Hepatobiliary findings:    Common bile duct:  Unable to visualize   Gallbladder stones: identified     Intra-abdominal fluid: not identified     Polyps: not identified     Sonographic Murphy's sign: positive  Comments:     Limited views due to patient habitus and discomfort     Medications Ordered in ED Medications  morphine 4 MG/ML injection 4 mg (4 mg Intravenous Given 08/09/20 0759)  ondansetron (ZOFRAN) injection 4 mg (4 mg Intravenous Given 08/09/20 0759)    ED Course  I have reviewed the triage vital signs and the nursing notes.  Pertinent labs & imaging results that were available during my care of the patient were reviewed by me and considered in my medical decision making (see chart for details).  This patient presents to the Emergency Department with complaint of abdominal pain. This involves an extensive number of treatment options, and is a complaint that carries with it a high risk of complications and morbidity.  The differential diagnosis includes, but is not limited to - biliary disease (most likely given RUQ tenderness, location of symptoms) vs pancreatitis vs kidney stone vs gastritis vs other  Bedside ultrasound attempted, limited by bowel gas and patient discomfort, but positive sonographic murphy sign and gallstone noted near neck of gallbladder.  RUQ ultrasound ordered  Her pain is focally reproducible in the RUQ which does lower my suspicion of ovarian torsion at this time, or ruptured cyst.  She has PCOS.  She is on birth control.  I  ordered, reviewed, and interpreted labs, including CMP and CBC.  WBC 10.5.  CMP unremarkable - LFT normal.  Lipase normal at 30.  Pregnancy negative.   I ordered medication IV morphine, IV zofran for abdominal pain and/or nausea I independently visualized and interpreted imaging which showed gallstones, no other evidence of acute cholecystitis and the monitor tracing which showed sinus rhythm  After the interventions stated above, I reevaluated the patient and found that they remained clinically stable.  Based on the patient's clinical exam, vital signs, risk factors, and ED testing, I felt that the patient's overall risk of life-threatening emergency such as bowel perforation, surgical emergency, or sepsis was quite low.  I suspect this clinical presentation is most consistent with biliary colic, but explained to the patient that this evaluation was not a definitive diagnostic workup.  I discussed outpatient follow up with primary care provider, and provided specialist office number on the patient's discharge paper if a referral was deemed necessary.  I discussed return precautions with the patient. I felt the patient was clinically stable for discharge.   Clinical Course as of 08/09/20 1353  Sun Aug 09, 2020  0906 IMPRESSION: Evidence of cholelithiasis without additional sonographic evidence of cholecystitis.    [MT]  [id number] On reassessment her pain is significantly relieved. [MT]  [id number] She reports that she has pain-free completely at this point.  I told her we will continue to monitor for period of time, if she remains pain free this may be biliary colic or a ruptured cyst and could be discharged. [MT]  1044 She has tolerated p.o., drank and also ate a sandwich.  She remains pain-free.  We discussed the possibility this may be a kidney stone, ruptured cyst, or biliary colic.  At this point I do think is reasonable to discharge her home.  I will give her general surgery follow-up.  Her sister  will be driving her home. [MT]    Clinical Course User Index [MT] Joannah Gitlin, Kermit Balo, MD    Final Clinical Impression(s) / ED Diagnoses Final diagnoses:  Nausea & vomiting  Biliary colic    Rx / DC Orders ED Discharge Orders    None  Terald Sleeper, MD 08/09/20 [phone]

## 2020-08-13 ENCOUNTER — Other Ambulatory Visit: Payer: Self-pay

## 2020-08-14 ENCOUNTER — Ambulatory Visit: Payer: 59 | Admitting: Nurse Practitioner

## 2020-08-14 ENCOUNTER — Encounter: Payer: Self-pay | Admitting: Nurse Practitioner

## 2020-08-14 VITALS — BP 104/64 | HR 97 | Temp 97.0°F | Ht 61.0 in | Wt 233.4 lb

## 2020-08-14 DIAGNOSIS — R739 Hyperglycemia, unspecified: Secondary | ICD-10-CM | POA: Diagnosis not present

## 2020-08-14 DIAGNOSIS — E049 Nontoxic goiter, unspecified: Secondary | ICD-10-CM | POA: Diagnosis not present

## 2020-08-14 DIAGNOSIS — Z6841 Body Mass Index (BMI) 40.0 and over, adult: Secondary | ICD-10-CM | POA: Diagnosis not present

## 2020-08-14 DIAGNOSIS — L270 Generalized skin eruption due to drugs and medicaments taken internally: Secondary | ICD-10-CM

## 2020-08-14 DIAGNOSIS — E282 Polycystic ovarian syndrome: Secondary | ICD-10-CM | POA: Diagnosis not present

## 2020-08-14 DIAGNOSIS — K8020 Calculus of gallbladder without cholecystitis without obstruction: Secondary | ICD-10-CM | POA: Diagnosis not present

## 2020-08-14 LAB — LIPID PANEL
Cholesterol: 212 mg/dL — ABNORMAL HIGH (ref 0–200)
HDL: 37.7 mg/dL — ABNORMAL LOW (ref >39.00)
LDL Cholesterol: 144 mg/dL — ABNORMAL HIGH (ref 0–99)
NonHDL: 174.36
Total CHOL/HDL Ratio: 6
Triglycerides: 153 mg/dL — ABNORMAL HIGH (ref 0.0–149.0)
VLDL: 30.6 mg/dL (ref 0.0–40.0)

## 2020-08-14 LAB — T4, FREE: Free T4: 0.69 ng/dL (ref 0.60–1.60)

## 2020-08-14 LAB — HEMOGLOBIN A1C: Hgb A1c MFr Bld: 5.5 % (ref 4.6–6.5)

## 2020-08-14 LAB — TSH: TSH: 1.73 u[IU]/mL (ref 0.35–4.50)

## 2020-08-14 MED ORDER — METHYLPREDNISOLONE ACETATE 40 MG/ML IJ SUSP: 40.0000 mg | Freq: Once | INTRAMUSCULAR | Status: DC

## 2020-08-14 NOTE — Patient Instructions (Signed)
Make dietary changes as discussed Start daily exercise.  Cholelithiasis  Cholelithiasis is a disease in which gallstones form in the gallbladder. The gallbladder is an organ that stores bile. Bile is a fluid that helps to digest fats. Gallstones begin as small crystals and can slowly grow into stones. They may cause no symptoms until they block the gallbladder duct, or cystic duct, when the gallbladder tightens (contracts) after food is eaten. This can cause pain and is known as a gallbladder attack, or biliary colic. There are two main types of gallstones:  Cholesterol stones. These are the most common type of gallstone. These stones are made of hardened cholesterol and are usually yellow-green in color. Cholesterol is a fat-like substance that is made in the liver.  Pigment stones. These are dark in color and are made of a red-yellow substance, called bilirubin,that forms when hemoglobin from red blood cells breaks down. What are the causes? This condition may be caused by an imbalance in the different parts that make bile. This can happen if the bile:  Has too much bilirubin. This can happen in certain blood diseases, such as sickle cell anemia.  Has too much cholesterol.  Does not have enough bile salts. These salts help the body absorb and digest fats. In some cases, this condition can also be caused by the gallbladder not emptying completely or often enough. This is common during pregnancy. What increases the risk? The following factors may make you more likely to develop this condition:  Being female.  Having multiple pregnancies. Health care providers sometimes advise removing diseased gallbladders before future pregnancies.  Eating a diet that is heavy in fried foods, fat, and refined carbohydrates, such as white bread and white rice.  Being obese.  Being older than age 34.  Using medicines that contain female hormones (estrogen) for a long time.  Losing weight  quickly.  Having a family history of gallstones.  Having certain medical problems, such as: ? Diabetes mellitus. ? Cystic fibrosis. ? Crohn's disease. ? Cirrhosis or other long-term (chronic) liver disease. ? Certain blood diseases, such as sickle cell anemia or leukemia. What are the signs or symptoms? In many cases, having gallstones causes no symptoms. When you have gallstones but do not have symptoms, you have silent gallstones. If a gallstone blocks your bile duct, it can cause a gallbladder attack. The main symptom of a gallbladder attack is sudden pain in the upper right part of the abdomen. The pain:  Usually comes at night or after eating.  Can last for one hour or more.  Can spread to your right shoulder, back, or chest.  Can feel like indigestion. This is discomfort, burning, or fullness in your upper abdomen. If the bile duct is blocked for more than a few hours, it can cause an infection or inflammation of your gallbladder (cholecystitis), liver, or pancreas. This can cause:  Nausea or vomiting.  Bloating.  Pain in your abdomen that lasts for 5 hours or longer.  Tenderness in your upper abdomen, often in the upper right section and under your rib cage.  Fever or chills.  Skin or the white parts of your eyes turning yellow (jaundice). This usually happens when a stone has blocked bile from passing through the common bile duct.  Dark urine or light-colored stools. How is this diagnosed? This condition may be diagnosed based on:  A physical exam.  Your medical history.  Ultrasound.  CT scan.  MRI. You may also have other tests, including:  Blood tests to check for signs of an infection or inflammation.  Cholescintigraphy, or HIDA scan. This is a scan of your gallbladder and bile ducts (biliary system) using non-harmful radioactive material and special cameras that can see the radioactive material.  Endoscopic retrograde cholangiopancreatogram. This  involves inserting a small tube with a camera on the end (endoscope) through your mouth to look at bile ducts and check for blockages. How is this treated? Treatment for this condition depends on the severity of the condition. Silent gallstones do not need treatment. Treatment may be needed if a blockage causes a gallbladder attack or other symptoms. Treatment may include:  Home care, if symptoms are not severe. ? During a simple gallbladder attack, stop eating and drinking for 12-24 hours (except for water and clear liquids). This helps to "cool down" your gallbladder. After 1 or 2 days, you can start to eat a diet of simple or clear foods, such as broths and crackers. ? You may also need medicines for pain or nausea or both. ? If you have cholecystitis and an infection, you will need antibiotics.  A hospital stay, if needed for pain control or for cholecystitis with severe infection.  Cholecystectomy, or surgery to remove your gallbladder. This is the most common treatment if all other treatments have not worked.  Medicines to break up gallstones. These are most effective at treating small gallstones. Medicines may be used for up to 6-12 months.  Endoscopic retrograde cholangiopancreatogram. A small basket can be attached to the endoscope and used to capture and remove gallstones, mainly those that are in the common bile duct. Follow these instructions at home: Medicines  Take over-the-counter and prescription medicines only as told by your health care provider.  If you were prescribed an antibiotic medicine, take it as told by your health care provider. Do not stop taking the antibiotic even if you start to feel better.  Ask your health care provider if the medicine prescribed to you requires you to avoid driving or using machinery. Eating and drinking  Drink enough fluid to keep your urine pale yellow. This is important during a gallbladder attack. Water and clear liquids are  preferred.  Follow a healthy diet. This includes: ? Reducing fatty foods, such as fried food and foods high in cholesterol. ? Reducing refined carbohydrates, such as white bread and white rice. ? Eating more fiber. Aim for foods such as almonds, fruit, and beans. Alcohol use  If you drink alcohol: ? Limit how much you use to:  0-1 drink a day for nonpregnant women.  0-2 drinks a day for men. ? Be aware of how much alcohol is in your drink. In the [place] one drink equals one 12 oz bottle of beer (355 mL), one 5 oz glass of wine (148 mL), or one 1 oz glass of hard liquor (44 mL). General instructions  Do not use any products that contain nicotine or tobacco, such as cigarettes, e-cigarettes, and chewing tobacco. If you need help quitting, ask your health care provider.  Maintain a healthy weight.  Keep all follow-up visits as told by your health care provider. These may include consultations with a surgeon or specialist. This is important. Where to find more information  [employer] of Diabetes and Digestive and Kidney Diseases: CarFlippers.tn Contact a health care provider if:  You think you have had a gallbladder attack.  You have been diagnosed with silent gallstones and you develop pain in your abdomen or indigestion.  You begin  to have attacks more often.  You have dark urine or light-colored stools. Get help right away if:  You have pain from a gallbladder attack that lasts for more than 2 hours.  You have pain in your abdomen that lasts for more than 5 hours or is getting worse.  You have a fever or chills.  You have nausea and vomiting that do not go away.  You develop jaundice. Summary  Cholelithiasis is a disease in which gallstones form in the gallbladder.  This condition may be caused by an imbalance in the different parts that make bile. This can happen if your bile has too much bilirubin or cholesterol, or does not have enough bile  salts.  Treatment for gallstones depends on the severity of the condition. Silent gallstones do not need treatment.  If gallstones cause a gallbladder attack or other symptoms, treatment usually involves not eating or drinking anything. Treatment may also include pain medicines and antibiotics, and it sometimes includes a hospital stay.  Surgery to remove the gallbladder is common if all other treatments have not worked. This information is not intended to replace advice given to you by your health care provider. Make sure you discuss any questions you have with your health care provider. Document Revised: 03/18/2019 Document Reviewed: 03/18/2019 Elsevier Patient Education  2021 [employer].   The Journal of Orthopaedic and Sports Physical Therapy, 44(10), 748. FishingAward.fi.2014.0506">  How to Increase Your Level of Physical Activity Getting regular physical activity is important for your overall health and well-being. Most people do not get enough exercise. There are easy ways to increase your level of physical activity, even if you have not been very active in the past or if you are just starting out. How can increasing my physical activity affect me? Physical activity has many short-term and long-term benefits. Being active on a regular basis can improve your physical and mental health as well as provide other benefits. Physical health benefits  Helping you lose weight or maintain a healthy weight.  Strengthening your muscles and bones.  Reducing your risk of certain long-term (chronic) diseases, including heart disease, cancer, and diabetes.  Being able to move around more easily and for longer periods of time without getting tired (increased stamina).  Improving your ability to fight off illness (enhanced immunity).  Being able to sleep better.  Helping you stay healthy as you get older, including: ? Helping you stay mobile, or capable of walking and moving  around. ? Preventing accidents, such as falls. ? Increasing life expectancy. Mental health benefits  Boosting your mood and improving your self-esteem.  Lowering your chance of having mental health problems, such as depression or anxiety.  Helping you feel good about your body. Other benefits  Finding new sources of fun and enjoyment.  Meeting new people who share a common interest. What steps can I take to be more physically active? Getting started  If you have a chronic illness or have not been active for a while, check with your health care provider about how to get started. Ask your health care provider what activities are safe for you.  Start out slowly. Walking or doing some simple chair exercises is a good place to start, especially if you have not been active before or for a long time.  Set goals that you can work toward. Ask your health care provider how much exercise is best for you. In general, most adults should: ? Do moderate-intensity exercise for at least 150  minutes each week (30 minutes on most days of the week) or vigorous exercise for at least 75 minutes each week, or a combination of these.  Moderate-intensity exercise can include walking at a quick pace, biking, yoga, water aerobics, or gardening.  Vigorous exercise involves activities that take more effort, such as jogging or running, playing sports, swimming laps, or jumping rope. ? Do strength exercises on at least 2 days each week. This can include weight lifting, body weight exercises, and resistance-band exercises.  Consider using a fitness tracker, such as a mobile phone app or a device worn like a watch, that will count the number of steps you take each day. Many people strive to reach 10,000 steps a day. Choosing activities  Try to find activities that you enjoy. You are more likely to commit to an exercise routine if it does not feel like a chore.  If you have bone or joint problems, choose low-impact  exercises, like walking or swimming.  Use these tips for being successful with an exercise plan: ? Find a workout partner for accountability. ? Join a group or class, such as an aerobics class, cycling class, or sports team. ? Make family time active. Go for a walk, bike, or swim. ? Include a variety of exercises each week. Being active in your daily routines Besides your formal exercise plans, you can find ways to do physical activity during your daily routines, such as:  Walking or biking to work or to the store.  Taking the stairs instead of the elevator.  Parking farther away from the door at work or at the store.  Planning walking meetings.  Walking around while you are on the phone.   Where to find more information  Centers for Disease Control and Prevention: CampusCasting.com.ptwww.cdc.gov/physicalactivity  President's Council on Fitness, Sports & Nutrition: www.fitness.gov  ChooseMyPlate: https://ball-collins.biz/www.choosemyplate.gov Contact a health care provider if:  You have headaches, muscle aches, or joint pain.  You feel dizzy or light-headed while exercising.  You faint.  You have chest pain while exercising. Summary  Exercise benefits your mind and body at any age, even if you are just starting out.  If you have a chronic illness or have not been active for a while, check with your health care provider before increasing your physical activity.  Choose activities that are safe and enjoyable for you. Ask your health care provider what activities are safe for you.  Start slowly. Tell your health care provider if you have problems as you start to increase your activity level. This information is not intended to replace advice given to you by your health care provider. Make sure you discuss any questions you have with your health care provider. Document Revised: 01/28/2019 Document Reviewed: 11/19/2018 Elsevier Patient Education  2021 [employer]

## 2020-08-14 NOTE — Progress Notes (Addendum)
Subjective:  Patient ID: Leslie Bell, female    DOB: 05-25-1990  Age: 30 y.o. MRN: [id number]  CC: Hospitalization Follow-up (Pt states she went to hospital for abdominal pain and was informed that she has gallstones, she was given morphine in the hospital but nothing else. Pt states she has been feeling better since leaving the hospital but has noticed a fine rash that has formed on her face and she thinks it could be a reaction to the morphine. Pt has been taking benadryl with no relief. )  Rash This is a new problem. The current episode started in the past 7 days. The problem is unchanged. The affected locations include the face, neck, left shoulder and right shoulder. The rash is characterized by redness and itchiness. She was exposed to a new medication (morphine). Pertinent negatives include no anorexia, congestion, cough, diarrhea, eye pain, facial edema, fatigue, fever, joint pain, nail changes, rhinorrhea, shortness of breath, sore throat or vomiting. Past treatments include antihistamine and moisturizer. The treatment provided no relief.   Calculus of gallbladder without cholecystitis without obstruction Reviewed ED note, lab results and ABD [place] report. She reports resolved ABD pain and nausea with diet modification.  Advised to continue with new diet and to call office for if symptoms returns despite new diet  Goiter Repeat TSH and T4: normal  Hyperglycemia Stable hgbA1c with metformin: 5.5%  Class 3 severe obesity due to excess calories with serious comorbidity and body mass index (BMI) of 40.0 to 44.9 in adult (HCC) Stable hgbA1c and thyroid function Abnormal lipid panel: it is important to maintain DASH diet and regular exercise to improve these numbers and decrease your risk for cardiovascular disease. F/up in 52month (fasting)   Reviewed past Medical, Social and Family history today.  Outpatient Medications Prior to Visit  Medication Sig Dispense Refill  .  Levonorgestrel-Ethinyl Estradiol (AMETHIA) 0.15-0.03 &0.01 MG tablet Take 1 tablet by mouth daily.    . metFORMIN (GLUCOPHAGE-XR) 750 MG 24 hr tablet Take 1 tablet (750 mg total) by mouth daily with breakfast. 90 tablet 1  . venlafaxine XR (EFFEXOR-XR) 75 MG 24 hr capsule Take 1 capsule (75 mg total) by mouth daily with breakfast. Need office visit for additional refills 90 capsule 3   No facility-administered medications prior to visit.    ROS See HPI  Objective:  BP 104/64 (BP Location: Left Arm, Patient Position: Sitting, Cuff Size: Large)   Pulse 97   Temp (!) 97 F (36.1 C) (Temporal)   Ht 5\' 1"  (1.549 m)   Wt 233 lb 6.4 oz (105.9 kg)   SpO2 99%   BMI 44.10 kg/m   Physical Exam Constitutional:      Appearance: She is obese.  Neck:     Thyroid: No thyroid mass or thyroid tenderness.  Cardiovascular:     Pulses: Normal pulses.  Pulmonary:     Effort: Pulmonary effort is normal.     Breath sounds: Normal breath sounds.  Abdominal:     General: Bowel sounds are normal.     Palpations: Abdomen is soft.     Tenderness: There is no abdominal tenderness. There is no guarding.  Musculoskeletal:     Cervical back: Normal range of motion and neck supple.     Right lower leg: No edema.     Left lower leg: No edema.  Lymphadenopathy:     Cervical: No cervical adenopathy.  Skin:    Findings: Erythema and rash present. Rash is macular and papular.  Neurological:     Mental Status: She is alert and oriented to person, place, and time.     Assessment & Plan:  This visit occurred during the SARS-CoV-2 public health emergency.  Safety protocols were in place, including screening questions prior to the visit, additional usage of staff PPE, and extensive cleaning of exam room while observing appropriate contact time as indicated for disinfecting solutions.   Jlee was seen today for hospitalization follow-up.  Diagnoses and all orders for this visit:  Calculus of  gallbladder without cholecystitis without obstruction  PCOS (polycystic ovarian syndrome) -     Hemoglobin A1c  Goiter -     TSH -     T4, free  Hyperglycemia -     Hemoglobin A1c  Class 3 severe obesity due to excess calories with serious comorbidity and body mass index (BMI) of 40.0 to 44.9 in adult [facility]) -     Lipid panel  Allergic drug rash -     methylPREDNISolone acetate (DEPO-MEDROL) injection 40 mg    Problem List Items Addressed This Visit      Digestive   Calculus of gallbladder without cholecystitis without obstruction - Primary    Reviewed ED note, lab results and ABD [place] report. She reports resolved ABD pain and nausea with diet modification.  Advised to continue with new diet and to call office for if symptoms returns despite new diet        Endocrine   Goiter    Repeat TSH and T4: normal      Relevant Orders   TSH (Completed)   T4, free (Completed)   Polycystic ovaries     Other   Class 3 severe obesity due to excess calories with serious comorbidity and body mass index (BMI) of 40.0 to 44.9 in adult (HCC)    Stable hgbA1c and thyroid function Abnormal lipid panel: it is important to maintain DASH diet and regular exercise to improve these numbers and decrease your risk for cardiovascular disease. F/up in 62month (fasting)      Relevant Orders   Lipid panel (Completed)   Hyperglycemia    Stable hgbA1c with metformin: 5.5%      Relevant Orders   Hemoglobin A1c (Completed)    Other Visit Diagnoses    Allergic drug rash       Relevant Medications   methylPREDNISolone acetate (DEPO-MEDROL) injection 40 mg       Follow-up: Return in about 6 months (around 02/13/2021) for CPE (fasting).  Alysia Penna, NP

## 2020-08-15 ENCOUNTER — Encounter: Payer: Self-pay | Admitting: Nurse Practitioner

## 2020-08-15 DIAGNOSIS — R739 Hyperglycemia, unspecified: Secondary | ICD-10-CM | POA: Insufficient documentation

## 2020-08-15 DIAGNOSIS — R7303 Prediabetes: Secondary | ICD-10-CM | POA: Insufficient documentation

## 2020-08-15 DIAGNOSIS — Z6841 Body Mass Index (BMI) 40.0 and over, adult: Secondary | ICD-10-CM | POA: Insufficient documentation

## 2020-08-15 NOTE — Assessment & Plan Note (Signed)
Stable hgbA1c and thyroid function Abnormal lipid panel: it is important to maintain DASH diet and regular exercise to improve these numbers and decrease your risk for cardiovascular disease. F/up in 3month (fasting)

## 2020-08-15 NOTE — Assessment & Plan Note (Signed)
Reviewed ED note, lab results and ABD [place] report. She reports resolved ABD pain and nausea with diet modification.  Advised to continue with new diet and to call office for if symptoms returns despite new diet

## 2020-08-15 NOTE — Assessment & Plan Note (Signed)
Repeat TSH and T4: normal

## 2020-08-15 NOTE — Assessment & Plan Note (Signed)
Stable hgbA1c with metformin: 5.5%

## 2020-08-19 ENCOUNTER — Other Ambulatory Visit: Payer: Self-pay

## 2020-08-19 DIAGNOSIS — E282 Polycystic ovarian syndrome: Secondary | ICD-10-CM

## 2020-08-19 MED ORDER — METFORMIN HCL ER 750 MG PO TB24: 750.0000 mg | ORAL_TABLET | Freq: Every day | ORAL | 1 refills | Status: DC

## 2020-09-30 ENCOUNTER — Other Ambulatory Visit: Payer: Self-pay

## 2020-10-01 ENCOUNTER — Ambulatory Visit: Payer: 59 | Admitting: Nurse Practitioner

## 2020-10-01 ENCOUNTER — Encounter: Payer: Self-pay | Admitting: Nurse Practitioner

## 2020-10-01 VITALS — BP 100/72 | HR 90 | Temp 97.4°F | Ht 61.0 in | Wt 232.6 lb

## 2020-10-01 DIAGNOSIS — Z6841 Body Mass Index (BMI) 40.0 and over, adult: Secondary | ICD-10-CM

## 2020-10-01 DIAGNOSIS — E282 Polycystic ovarian syndrome: Secondary | ICD-10-CM | POA: Diagnosis not present

## 2020-10-01 DIAGNOSIS — E559 Vitamin D deficiency, unspecified: Secondary | ICD-10-CM | POA: Diagnosis not present

## 2020-10-01 DIAGNOSIS — R7303 Prediabetes: Secondary | ICD-10-CM

## 2020-10-01 MED ORDER — VITAMIN D3 125 MCG (5000 UT) PO CAPS: 5000.0000 [IU] | ORAL_CAPSULE | Freq: Every day | ORAL | 0 refills | Status: AC

## 2020-10-01 MED ORDER — LIRAGLUTIDE 18 MG/3ML ~~LOC~~ SOPN: PEN_INJECTOR | SUBCUTANEOUS | 0 refills | Status: DC

## 2020-10-01 NOTE — Patient Instructions (Signed)
Stop metformin Start saxenda injections. Make lifestyle changes as discussed.  Liraglutide injection (Weight Management) What is this medicine? LIRAGLUTIDE (LIR a GLOO tide) is used to help people lose weight and maintain weight loss. It is used with a reduced-calorie diet and exercise. This medicine may be used for other purposes; ask your health care provider or pharmacist if you have questions. COMMON BRAND NAME(S): Saxenda What should I tell my health care provider before I take this medicine? They need to know if you have any of these conditions:  endocrine tumors (MEN 2) or if someone in your family had these tumors  gallbladder disease  high cholesterol  history of alcohol abuse problem  history of pancreatitis  kidney disease or if you are on dialysis  liver disease  previous swelling of the tongue, face, or lips with difficulty breathing, difficulty swallowing, hoarseness, or tightening of the throat  stomach problems  suicidal thoughts, plans, or attempt; a previous suicide attempt by you or a family member  thyroid cancer or if someone in your family had thyroid cancer  an unusual or allergic reaction to liraglutide, other medicines, foods, dyes, or preservatives  pregnant or trying to get pregnant  breast-feeding How should I use this medicine? This medicine is for injection under the skin of your upper leg, stomach area, or upper arm. You will be taught how to prepare and give this medicine. Use exactly as directed. Take your medicine at regular intervals. Do not take it more often than directed. This medicine comes with INSTRUCTIONS FOR USE. Ask your pharmacist for directions on how to use this medicine. Read the information carefully. Talk to your pharmacist or health care provider if you have questions. It is important that you put your used needles and syringes in a special sharps container. Do not put them in a trash can. If you do not have a sharps  container, call your pharmacist or healthcare provider to get one. A special MedGuide will be given to you by the pharmacist with each prescription and refill. Be sure to read this information carefully each time. Talk to your health care provider about the use of this medicine in children. While it may be prescribed for children as young as 20 years of age for selected conditions, precautions do apply. Overdosage: If you think you have taken too much of this medicine contact a poison control center or emergency room at once. NOTE: This medicine is only for you. Do not share this medicine with others. What if I miss a dose? If you miss a dose, take it as soon as you can. If it is almost time for your next dose, take only that dose. Do not take double or extra doses. If you miss your dose for 3 days or more, call your doctor or health care professional to talk about how to restart this medicine. What may interact with this medicine?  insulin and other medicines for diabetes This list may not describe all possible interactions. Give your health care provider a list of all the medicines, herbs, non-prescription drugs, or dietary supplements you use. Also tell them if you smoke, drink alcohol, or use illegal drugs. Some items may interact with your medicine. What should I watch for while using this medicine? Visit your doctor or health care professional for regular checks on your progress. Drink plenty of fluids while taking this medicine. Check with your doctor or health care professional if you get an attack of severe diarrhea, nausea, and  vomiting. The loss of too much body fluid can make it dangerous for you to take this medicine. This medicine may affect blood sugar levels. Ask your healthcare provider if changes in diet or medicines are needed if you have diabetes. Patients and their families should watch out for worsening depression or thoughts of suicide. Also watch out for sudden changes in  feelings such as feeling anxious, agitated, panicky, irritable, hostile, aggressive, impulsive, severely restless, overly excited and hyperactive, or not being able to sleep. If this happens, especially at the beginning of treatment or after a change in dose, call your health care professional. Women should inform their health care provider if they wish to become pregnant or think they might be pregnant. Losing weight while pregnant is not advised and may cause harm to the unborn child. Talk to your health care provider for more information. What side effects may I notice from receiving this medicine? Side effects that you should report to your doctor or health care professional as soon as possible:  allergic reactions like skin rash, itching or hives, swelling of the face, lips, or tongue  breathing problems  diarrhea that continues or is severe  lump or swelling on the neck  severe nausea  signs and symptoms of infection like fever or chills; cough; sore throat; pain or trouble passing urine  signs and symptoms of low blood sugar such as feeling anxious; confusion; dizziness; increased hunger; unusually weak or tired; increased sweating; shakiness; cold, clammy skin; irritable; headache; blurred vision; fast heartbeat; loss of consciousness  signs and symptoms of kidney injury like trouble passing urine or change in the amount of urine  trouble swallowing  unusual stomach upset or pain  vomiting Side effects that usually do not require medical attention (report to your doctor or health care professional if they continue or are bothersome):  constipation  decreased appetite  diarrhea  fatigue  headache  nausea  pain, redness, or irritation at site where injected  stomach upset  stuffy or runny nose This list may not describe all possible side effects. Call your doctor for medical advice about side effects. You may report side effects to FDA at 1-800-FDA-1088. Where  should I keep my medicine? Keep out of the reach of children. Store unopened pen in a refrigerator between 2 and 8 degrees C (36 and 46 degrees F). Do not freeze or use if the medicine has been frozen. Protect from light and excessive heat. After you first use the pen, it can be stored at room temperature between 15 and 30 degrees C (59 and 86 degrees F) or in a refrigerator. Throw away your used pen after 30 days or after the expiration date, whichever comes first. Do not store your pen with the needle attached. If the needle is left on, medicine may leak from the pen. NOTE: This sheet is a summary. It may not cover all possible information. If you have questions about this medicine, talk to your doctor, pharmacist, or health care provider.  2021 Elsevier/Gold Standard (2019-04-18 13:56:25)   The Journal of Orthopaedic and Sports Physical Therapy, 44(10), 748. FishingAward.fi.[email or web address]">  How to Increase Your Level of Physical Activity Getting regular physical activity is important for your overall health and well-being. Most people do not get enough exercise. There are easy ways to increase your level of physical activity, even if you have not been very active in the past or if you are just starting out. How can increasing my physical activity affect me?  Physical activity has many short-term and long-term benefits. Being active on a regular basis can improve your physical and mental health as well as provide other benefits. Physical health benefits  Helping you lose weight or maintain a healthy weight.  Strengthening your muscles and bones.  Reducing your risk of certain long-term (chronic) diseases, including heart disease, cancer, and diabetes.  Being able to move around more easily and for longer periods of time without getting tired (increased stamina).  Improving your ability to fight off illness (enhanced immunity).  Being able to sleep better.  Helping you stay  healthy as you get older, including: ? Helping you stay mobile, or capable of walking and moving around. ? Preventing accidents, such as falls. ? Increasing life expectancy. Mental health benefits  Boosting your mood and improving your self-esteem.  Lowering your chance of having mental health problems, such as depression or anxiety.  Helping you feel good about your body. Other benefits  Finding new sources of fun and enjoyment.  Meeting new people who share a common interest. What steps can I take to be more physically active? Getting started  If you have a chronic illness or have not been active for a while, check with your health care provider about how to get started. Ask your health care provider what activities are safe for you.  Start out slowly. Walking or doing some simple chair exercises is a good place to start, especially if you have not been active before or for a long time.  Set goals that you can work toward. Ask your health care provider how much exercise is best for you. In general, most adults should: ? Do moderate-intensity exercise for at least 150 minutes each week (30 minutes on most days of the week) or vigorous exercise for at least 75 minutes each week, or a combination of these.  Moderate-intensity exercise can include walking at a quick pace, biking, yoga, water aerobics, or gardening.  Vigorous exercise involves activities that take more effort, such as jogging or running, playing sports, swimming laps, or jumping rope. ? Do strength exercises on at least 2 days each week. This can include weight lifting, body weight exercises, and resistance-band exercises.  Consider using a fitness tracker, such as a mobile phone app or a device worn like a watch, that will count the number of steps you take each day. Many people strive to reach 10,000 steps a day. Choosing activities  Try to find activities that you enjoy. You are more likely to commit to an exercise  routine if it does not feel like a chore.  If you have bone or joint problems, choose low-impact exercises, like walking or swimming.  Use these tips for being successful with an exercise plan: ? Find a workout partner for accountability. ? Join a group or class, such as an aerobics class, cycling class, or sports team. ? Make family time active. Go for a walk, bike, or swim. ? Include a variety of exercises each week. Being active in your daily routines Besides your formal exercise plans, you can find ways to do physical activity during your daily routines, such as:  Walking or biking to work or to the store.  Taking the stairs instead of the elevator.  Parking farther away from the door at work or at the store.  Planning walking meetings.  Walking around while you are on the phone.   Where to find more information  Centers for Disease Control and Prevention: CampusCasting.com.pt  [employer] on [employer], Sports & Nutrition: www.fitness.gov  ChooseMyPlate: https://ball-collins.biz/ Contact a health care provider if:  You have headaches, muscle aches, or joint pain.  You feel dizzy or light-headed while exercising.  You faint.  You have chest pain while exercising. Summary  Exercise benefits your mind and body at any age, even if you are just starting out.  If you have a chronic illness or have not been active for a while, check with your health care provider before increasing your physical activity.  Choose activities that are safe and enjoyable for you. Ask your health care provider what activities are safe for you.  Start slowly. Tell your health care provider if you have problems as you start to increase your activity level. This information is not intended to replace advice given to you by your health care provider. Make sure you discuss any questions you have with your health care provider. Document Revised: 01/28/2019 Document Reviewed:  11/19/2018 Elsevier Patient Education  2021 Elsevier Inc.   Calorie Counting for [employer] Calories are units of energy. Your body needs a certain number of calories from food to keep going throughout the day. When you eat or drink more calories than your body needs, your body stores the extra calories mostly as fat. When you eat or drink fewer calories than your body needs, your body burns fat to get the energy it needs. Calorie counting means keeping track of how many calories you eat and drink each day. Calorie counting can be helpful if you need to lose weight. If you eat fewer calories than your body needs, you should lose weight. Ask your health care provider what a healthy weight is for you. For calorie counting to work, you will need to eat the right number of calories each day to lose a healthy amount of weight per week. A dietitian can help you figure out how many calories you need in a day and will suggest ways to reach your calorie goal.  A healthy amount of weight to lose each week is usually 1-2 lb (0.5-0.9 kg). This usually means that your daily calorie intake should be reduced by 500-750 calories.  Eating 1,200-1,500 calories a day can help most women lose weight.  Eating 1,500-1,800 calories a day can help most men lose weight. What do I need to know about calorie counting? Work with your health care provider or dietitian to determine how many calories you should get each day. To meet your daily calorie goal, you will need to:  Find out how many calories are in each food that you would like to eat. Try to do this before you eat.  Decide how much of the food you plan to eat.  Keep a food log. Do this by writing down what you ate and how many calories it had. To successfully lose weight, it is important to balance calorie counting with a healthy lifestyle that includes regular activity. Where do I find calorie information? The number of calories in a food can be found on a  Nutrition Facts label. If a food does not have a Nutrition Facts label, try to look up the calories online or ask your dietitian for help. Remember that calories are listed per serving. If you choose to have more than one serving of a food, you will have to multiply the calories per serving by the number of servings you plan to eat. For example, the label on a package of bread might say that a  serving size is 1 slice and that there are 90 calories in a serving. If you eat 1 slice, you will have eaten 90 calories. If you eat 2 slices, you will have eaten 180 calories.   How do I keep a food log? After each time that you eat, record the following in your food log as soon as possible:  What you ate. Be sure to include toppings, sauces, and other extras on the food.  How much you ate. This can be measured in cups, ounces, or number of items.  How many calories were in each food and drink.  The total number of calories in the food you ate. Keep your food log near you, such as in a pocket-sized notebook or on an app or website on your mobile phone. Some programs will calculate calories for you and show you how many calories you have left to meet your daily goal. What are some portion-control tips?  Know how many calories are in a serving. This will help you know how many servings you can have of a certain food.  Use a measuring cup to measure serving sizes. You could also try weighing out portions on a kitchen scale. With time, you will be able to estimate serving sizes for some foods.  Take time to put servings of different foods on your favorite plates or in your favorite bowls and cups so you know what a serving looks like.  Try not to eat straight from a food's packaging, such as from a bag or box. Eating straight from the package makes it hard to see how much you are eating and can lead to overeating. Put the amount you would like to eat in a cup or on a plate to make sure you are eating the  right portion.  Use smaller plates, glasses, and bowls for smaller portions and to prevent overeating.  Try not to multitask. For example, avoid watching TV or using your computer while eating. If it is time to eat, sit down at a table and enjoy your food. This will help you recognize when you are full. It will also help you be more mindful of what and how much you are eating. What are tips for following this plan? Reading food labels  Check the calorie count compared with the serving size. The serving size may be smaller than what you are used to eating.  Check the source of the calories. Try to choose foods that are high in protein, fiber, and vitamins, and low in saturated fat, trans fat, and sodium. Shopping  Read nutrition labels while you shop. This will help you make healthy decisions about which foods to buy.  Pay attention to nutrition labels for low-fat or fat-free foods. These foods sometimes have the same number of calories or more calories than the full-fat versions. They also often have added sugar, starch, or salt to make up for flavor that was removed with the fat.  Make a grocery list of lower-calorie foods and stick to it. Cooking  Try to cook your favorite foods in a healthier way. For example, try baking instead of frying.  Use low-fat dairy products. Meal planning  Use more fruits and vegetables. One-half of your plate should be fruits and vegetables.  Include lean proteins, such as chicken, [place]key, and fish. Lifestyle Each week, aim to do one of the following:  150 minutes of moderate exercise, such as walking.  75 minutes of vigorous exercise, such as running. General  information  Know how many calories are in the foods you eat most often. This will help you calculate calorie counts faster.  Find a way of tracking calories that works for you. Get creative. Try different apps or programs if writing down calories does not work for you. What foods should I  eat?  Eat nutritious foods. It is better to have a nutritious, high-calorie food, such as an avocado, than a food with few nutrients, such as a bag of potato chips.  Use your calories on foods and drinks that will fill you up and will not leave you hungry soon after eating. ? Examples of foods that fill you up are nuts and nut butters, vegetables, lean proteins, and high-fiber foods such as whole grains. High-fiber foods are foods with more than 5 g of fiber per serving.  Pay attention to calories in drinks. Low-calorie drinks include water and unsweetened drinks. The items listed above may not be a complete list of foods and beverages you can eat. Contact a dietitian for more information.   What foods should I limit? Limit foods or drinks that are not good sources of vitamins, minerals, or protein or that are high in unhealthy fats. These include:  Candy.  Other sweets.  Sodas, specialty coffee drinks, alcohol, and juice. The items listed above may not be a complete list of foods and beverages you should avoid. Contact a dietitian for more information. How do I count calories when eating out?  Pay attention to portions. Often, portions are much larger when eating out. Try these tips to keep portions smaller: ? Consider sharing a meal instead of getting your own. ? If you get your own meal, eat only half of it. Before you start eating, ask for a container and put half of your meal into it. ? When available, consider ordering smaller portions from the menu instead of full portions.  Pay attention to your food and drink choices. Knowing the way food is cooked and what is included with the meal can help you eat fewer calories. ? If calories are listed on the menu, choose the lower-calorie options. ? Choose dishes that include vegetables, fruits, whole grains, low-fat dairy products, and lean proteins. ? Choose items that are boiled, broiled, grilled, or steamed. Avoid items that are  buttered, battered, fried, or served with cream sauce. Items labeled as crispy are usually fried, unless stated otherwise. ? Choose water, low-fat milk, unsweetened iced tea, or other drinks without added sugar. If you want an alcoholic beverage, choose a lower-calorie option, such as a glass of wine or light beer. ? Ask for dressings, sauces, and syrups on the side. These are usually high in calories, so you should limit the amount you eat. ? If you want a salad, choose a garden salad and ask for grilled meats. Avoid extra toppings such as bacon, cheese, or fried items. Ask for the dressing on the side, or ask for olive oil and vinegar or lemon to use as dressing.  Estimate how many servings of a food you are given. Knowing serving sizes will help you be aware of how much food you are eating at restaurants. Where to find more information  Centers for Disease Control and Prevention: FootballExhibition.com.br  [place]. Department of Agriculture: WrestlingReporter.dk Summary  Calorie counting means keeping track of how many calories you eat and drink each day. If you eat fewer calories than your body needs, you should lose weight.  A healthy amount of weight to  lose per week is usually 1-2 lb (0.5-0.9 kg). This usually means reducing your daily calorie intake by 500-750 calories.  The number of calories in a food can be found on a Nutrition Facts label. If a food does not have a Nutrition Facts label, try to look up the calories online or ask your dietitian for help.  Use smaller plates, glasses, and bowls for smaller portions and to prevent overeating.  Use your calories on foods and drinks that will fill you up and not leave you hungry shortly after a meal. This information is not intended to replace advice given to you by your health care provider. Make sure you discuss any questions you have with your health care provider. Document Revised: 06/06/2019 Document Reviewed: 06/06/2019 Elsevier Patient Education  2021  [employer].

## 2020-10-01 NOTE — Progress Notes (Signed)
Subjective:  Patient ID: Leslie Bell, female    DOB: 10-09-1990  Age: 30 y.o. MRN: [id number]  CC: Acute Visit (Pt would like to discuss weight loss and lab work. Pt states she would like lab work to check vitamin D because she has been feeling fatigued lately and has a hx of low vitamin D.)  HPI  Class 3 severe obesity due to excess calories with serious comorbidity and body mass index (BMI) of 40.0 to 44.9 in adult [facility]) She has difficulty controlling her food portions and engaging in daily exercise. This is due to fatigue and erratic work schedule. She is unable to afford appts at weight management clinic. With underlying hx of anxiety and depression, I do not recommend use of phentermine. I suggested use of saxenda injections. Advised her of possible side effects. She denies any known FHx of thyroid cancer. She agreed to start saxenda injection. rx sent Strongly recommended decreasing calorie intake and engaging in daily exercise regimen. Advised to hold metformin F/up in 71month  BP Readings from Last 3 Encounters:  10/01/20 100/72  08/14/20 104/64  08/09/20 (!) 147/81   Wt Readings from Last 3 Encounters:  10/01/20 232 lb 9.6 oz (105.5 kg)  08/14/20 233 lb 6.4 oz (105.9 kg)  04/21/20 236 lb (107 kg)   .fam Reviewed past Medical, Social and Family history today.  Outpatient Medications Prior to Visit  Medication Sig Dispense Refill  . Levonorgestrel-Ethinyl Estradiol (AMETHIA) 0.15-0.03 &0.01 MG tablet Take 1 tablet by mouth daily.    Marland Kitchen venlafaxine XR (EFFEXOR-XR) 75 MG 24 hr capsule Take 1 capsule (75 mg total) by mouth daily with breakfast. Need office visit for additional refills 90 capsule 3  . metFORMIN (GLUCOPHAGE-XR) 750 MG 24 hr tablet Take 1 tablet (750 mg total) by mouth daily with breakfast. 90 tablet 1   Facility-Administered Medications Prior to Visit  Medication Dose Route Frequency Provider Last Rate Last Admin  . methylPREDNISolone acetate (DEPO-MEDROL)  injection 40 mg  40 mg Intramuscular Once Dynasti Kerman, Bonna Gains, NP        ROS See HPI  Objective:  BP 100/72 (BP Location: Left Arm, Patient Position: Sitting, Cuff Size: Large)   Pulse 90   Temp (!) 97.4 F (36.3 C) (Temporal)   Ht 5\' 1"  (1.549 m)   Wt 232 lb 9.6 oz (105.5 kg)   SpO2 98%   BMI 43.95 kg/m   Physical Exam Vitals reviewed.  Cardiovascular:     Rate and Rhythm: Normal rate.     Pulses: Normal pulses.  Pulmonary:     Effort: Pulmonary effort is normal.  Neurological:     Mental Status: She is alert and oriented to person, place, and time.  Psychiatric:        Mood and Affect: Mood normal.        Behavior: Behavior normal.        Thought Content: Thought content normal.    Assessment & Plan:  This visit occurred during the SARS-CoV-2 public health emergency.  Safety protocols were in place, including screening questions prior to the visit, additional usage of staff PPE, and extensive cleaning of exam room while observing appropriate contact time as indicated for disinfecting solutions.   Saja was seen today for acute visit.  Diagnoses and all orders for this visit:  Class 3 severe obesity due to excess calories with serious comorbidity and body mass index (BMI) of 40.0 to 44.9 in adult (HCC) -     liraglutide (VICTOZA)  18 MG/3ML SOPN; Inject 0.6 mg into the skin daily for 14 days, THEN 1.2 mg daily for 14 days, THEN 1.8 mg daily for 14 days, THEN 2.5 mg daily for 14 days.  Vitamin D deficiency -     Cholecalciferol (VITAMIN D3) 125 MCG (5000 UT) CAPS; Take 1 capsule (5,000 Units total) by mouth daily.  Polycystic ovaries -     liraglutide (VICTOZA) 18 MG/3ML SOPN; Inject 0.6 mg into the skin daily for 14 days, THEN 1.2 mg daily for 14 days, THEN 1.8 mg daily for 14 days, THEN 2.5 mg daily for 14 days.  Prediabetes -     liraglutide (VICTOZA) 18 MG/3ML SOPN; Inject 0.6 mg into the skin daily for 14 days, THEN 1.2 mg daily for 14 days, THEN 1.8 mg daily for  14 days, THEN 2.5 mg daily for 14 days.   Problem List Items Addressed This Visit      Endocrine   Polycystic ovaries   Relevant Medications   liraglutide (VICTOZA) 18 MG/3ML SOPN     Other   Class 3 severe obesity due to excess calories with serious comorbidity and body mass index (BMI) of 40.0 to 44.9 in adult [facility]) - Primary    She has difficulty controlling her food portions and engaging in daily exercise. This is due to fatigue and erratic work schedule. She is unable to afford appts at weight management clinic. With underlying hx of anxiety and depression, I do not recommend use of phentermine. I suggested use of saxenda injections. Advised her of possible side effects. She denies any known FHx of thyroid cancer. She agreed to start saxenda injection. rx sent Strongly recommended decreasing calorie intake and engaging in daily exercise regimen. Advised to hold metformin F/up in 106month      Relevant Medications   liraglutide (VICTOZA) 18 MG/3ML SOPN   Prediabetes   Relevant Medications   liraglutide (VICTOZA) 18 MG/3ML SOPN   Vitamin D deficiency   Relevant Medications   Cholecalciferol (VITAMIN D3) 125 MCG (5000 UT) CAPS      Follow-up: Return in about 3 months (around 01/01/2021) for Obesity, Prediabetes, hyperlipidemia ( , fasting).  Alysia Penna, NP

## 2020-10-01 NOTE — Assessment & Plan Note (Addendum)
She has difficulty controlling her food portions and engaging in daily exercise. This is due to fatigue and erratic work schedule. She is unable to afford appts at weight management clinic. With underlying hx of anxiety and depression, I do not recommend use of phentermine. I suggested use of saxenda injections. Advised her of possible side effects. She denies any known FHx of thyroid cancer. She agreed to start saxenda injection. rx sent Strongly recommended decreasing calorie intake and engaging in daily exercise regimen. Advised to hold metformin F/up in 68month

## 2020-10-02 ENCOUNTER — Telehealth: Payer: Self-pay | Admitting: Nurse Practitioner

## 2020-10-02 DIAGNOSIS — R7303 Prediabetes: Secondary | ICD-10-CM

## 2020-10-02 DIAGNOSIS — Z6841 Body Mass Index (BMI) 40.0 and over, adult: Secondary | ICD-10-CM

## 2020-10-02 NOTE — Telephone Encounter (Signed)
What is the name of the medication? Needles to go with her injectable medication we subscripted to her.    Have you contacted your pharmacy to request a refill? Yes, she was told she would need a script for this, the needles are to expensive OTC for her.   Which pharmacy would you like this sent to? Pharmacy  [facility] [phone] [facility] [facility], [place] - [address]  [address] [place] [address]  Phone:  [phone] Fax:  [phone]  DEA #:  [id number]      Patient notified that their request is being sent to the clinical staff for review and that they should receive a call once it is complete. If they do not receive a call within 72 hours they can check with their pharmacy or our office.

## 2020-10-12 MED ORDER — BD PEN NEEDLE MINI U/F 31G X 5 MM MISC: 3 refills | Status: DC

## 2020-10-12 NOTE — Telephone Encounter (Signed)
Rx sent to pharmacy   

## 2020-10-15 ENCOUNTER — Emergency Department
Admission: EM | Admit: 2020-10-15 | Discharge: 2020-10-15 | Disposition: A | Discharge status: Home / Self Care | Payer: 59 | Source: Home / Self Care

## 2020-10-15 ENCOUNTER — Other Ambulatory Visit: Payer: Self-pay

## 2020-10-15 ENCOUNTER — Encounter: Payer: Self-pay | Admitting: Emergency Medicine

## 2020-10-15 DIAGNOSIS — G43019 Migraine without aura, intractable, without status migrainosus: Secondary | ICD-10-CM | POA: Diagnosis not present

## 2020-10-15 DIAGNOSIS — R519 Headache, unspecified: Secondary | ICD-10-CM

## 2020-10-15 MED ORDER — SUMATRIPTAN SUCCINATE 50 MG PO TABS: 50.0000 mg | ORAL_TABLET | Freq: Every day | ORAL | 0 refills | Status: DC

## 2020-10-15 MED ORDER — ACETAMINOPHEN 500 MG PO TABS
1000.0000 mg | ORAL_TABLET | Freq: Once | ORAL | Status: AC
  Administered 2020-10-15: 1000 mg via ORAL

## 2020-10-15 NOTE — ED Triage Notes (Signed)
Patient is c/o headache x 4 days.  Patient had a history of migraines when she was a pre-teen but that resolved. Headache is constant across the forehead and temples.  Patient has taken Tylenol, Ibuprofen and Excedrin Migraine, some relief.  Patient does have nausea, no visual disturbance.

## 2020-10-15 NOTE — ED Provider Notes (Signed)
[facility]    CSN: [id number] Arrival date & time: 10/15/20  1409      History   Chief Complaint Chief Complaint  Patient presents with   Migraine    HPI Leslie Bell is a 30 y.o. female.   HPI 30 year old female presents with migraine/headache for 4 days.  Reports taking OTC ibuprofen, Tylenol, and Excedrin with some relief.  Reports bilateral frontal and temporal areas headache is described as dull pounding constant and can be sharp at times.  Past Medical History:  Diagnosis Date   Depression    PCOS (polycystic ovarian syndrome)     Patient Active Problem List   Diagnosis Date Noted   Class 3 severe obesity due to excess calories with serious comorbidity and body mass index (BMI) of 40.0 to 44.9 in adult [facility]) 08/15/2020   Prediabetes 08/15/2020   Calculus of gallbladder without cholecystitis without obstruction 08/14/2020   Mid back pain, chronic 12/12/2019   Vitamin D deficiency 03/19/2019   Depression, major, single episode, moderate (HCC) 03/19/2019   GAD (generalized anxiety disorder) 03/19/2019   Goiter 03/19/2019   Polycystic ovaries 03/11/2019    History reviewed. No pertinent surgical history.  OB History   No obstetric history on file.      Home Medications    Prior to Admission medications   Medication Sig Start Date End Date Taking? Authorizing Provider  Cholecalciferol (VITAMIN D3) 125 MCG (5000 UT) CAPS Take 1 capsule (5,000 Units total) by mouth daily. 10/01/20  Yes Nche, Bonna Gains, NP  Insulin Pen Needle (B-D UF III MINI PEN NEEDLES) 31G X 5 MM MISC Use as directed with Victoza daily. 10/12/20  Yes Nche, Bonna Gains, NP  Levonorgestrel-Ethinyl Estradiol (AMETHIA) 0.15-0.03 &0.01 MG tablet Take 1 tablet by mouth daily. 01/09/20  Yes [provider]  liraglutide (VICTOZA) 18 MG/3ML SOPN Inject 0.6 mg into the skin daily for 14 days, THEN 1.2 mg daily for 14 days, THEN 1.8 mg daily for 14 days, THEN 2.5 mg daily for 14  days. 10/01/20 11/26/20 Yes Nche, Bonna Gains, NP  SUMAtriptan (IMITREX) 50 MG tablet Take 1 tablet (50 mg total) by mouth daily for 10 doses. May repeat in 2 hours if headache persists or recurs. 10/15/20 10/25/20 Yes Trevor Iha, FNP  venlafaxine XR (EFFEXOR-XR) 75 MG 24 hr capsule Take 1 capsule (75 mg total) by mouth daily with breakfast. Need office visit for additional refills 12/12/19  Yes Nche, Bonna Gains, NP    Family History Family History  Problem Relation Age of Onset   Hypertension Mother    Hypertension Father    Alcohol abuse Father    Diabetes Maternal Grandmother    Kidney disease Maternal Grandmother    Stroke Maternal Grandmother    Stroke Maternal Grandfather    Drug abuse Paternal Grandmother    Heart disease Paternal Grandmother    Cancer Paternal Grandfather 37       unknown    Social History Social History   Tobacco Use   Smoking status: Never   Smokeless tobacco: Never  Vaping Use   Vaping Use: Never used  Substance Use Topics   Alcohol use: Yes    Alcohol/week: 1.0 standard drink    Types: 1 Glasses of wine per week    Comment: once a week   Drug use: Never     Allergies   Patient has no known allergies.   Review of Systems Review of Systems  Constitutional: Negative.   HENT: Negative.  Eyes: Negative.   Respiratory: Negative.    Cardiovascular: Negative.   Gastrointestinal: Negative.   Genitourinary: Negative.   Musculoskeletal: Negative.   Skin: Negative.   Neurological:  Positive for headaches.    Physical Exam Triage Vital Signs ED Triage Vitals  Enc Vitals Group     BP 10/15/20 1429 124/87     Pulse Rate 10/15/20 1429 89     Resp --      Temp 10/15/20 1429 99.8 F (37.7 C)     Temp Source 10/15/20 1429 Oral     SpO2 10/15/20 1429 98 %     Weight --      Height --      Head Circumference --      Peak Flow --      Pain Score 10/15/20 1430 7     Pain Loc --      Pain Edu? --      Excl. in GC? --    No data  found.  Updated Vital Signs BP 124/87 (BP Location: Left Arm)   Pulse 89   Temp 99.8 F (37.7 C) (Oral)   LMP 08/07/2020   SpO2 98%        Physical Exam Constitutional:      General: She is not in acute distress.    Appearance: Normal appearance. She is obese. She is not ill-appearing.  HENT:     Head: Normocephalic and atraumatic.     Right Ear: Tympanic membrane and ear canal normal.     Left Ear: Tympanic membrane and ear canal normal.     Nose: Nose normal. No congestion or rhinorrhea.     Mouth/Throat:     Mouth: Mucous membranes are moist.     Pharynx: Oropharynx is clear.  Eyes:     Extraocular Movements: Extraocular movements intact.     Conjunctiva/sclera: Conjunctivae normal.     Pupils: Pupils are equal, round, and reactive to light.  Cardiovascular:     Rate and Rhythm: Normal rate and regular rhythm.     Pulses: Normal pulses.     Heart sounds: Normal heart sounds.  Pulmonary:     Effort: Pulmonary effort is normal.     Breath sounds: Normal breath sounds.     Comments: No adventitious breath sounds noted Musculoskeletal:        General: Normal range of motion.     Cervical back: Normal range of motion and neck supple. No rigidity or tenderness.  Skin:    General: Skin is warm and dry.  Neurological:     General: No focal deficit present.     Mental Status: She is alert and oriented to person, place, and time.     Cranial Nerves: No cranial nerve deficit.     Sensory: No sensory deficit.     Motor: No weakness.     Gait: Gait normal.  Psychiatric:        Mood and Affect: Mood normal.        Behavior: Behavior normal.     UC Treatments / Results  Labs (all labs ordered are listed, but only abnormal results are displayed) Labs Reviewed - No data to display  EKG   Radiology No results found.  Procedures Procedures (including critical care time)  Medications Ordered in UC Medications  acetaminophen (TYLENOL) tablet 1,000 mg (1,000 mg Oral  Given 10/15/20 1504)    Initial Impression / Assessment and Plan / UC Course  I have reviewed the triage vital  signs and the nursing notes.  Pertinent labs & imaging results that were available during my care of the patient were reviewed by me and considered in my medical decision making (see chart for details).    MDM: 1. Bad headache, 2.  Intractable migraine without aura without status migrainous.  Patient discharged home, hemodynamically stable. Final Clinical Impressions(s) / UC Diagnoses   Final diagnoses:  Bad headache  Intractable migraine without aura and without status migrainosus     Discharge Instructions      Advised patient may take medication as directed.  If headache/migraine symptoms persist, worsen and or unresolved please follow-up with PCP or here for further evaluation.     ED Prescriptions     Medication Sig Dispense Auth. Provider   SUMAtriptan (IMITREX) 50 MG tablet Take 1 tablet (50 mg total) by mouth daily for 10 doses. May repeat in 2 hours if headache persists or recurs. 10 tablet Trevor Iha, FNP      PDMP not reviewed this encounter.   Trevor Iha, FNP 10/15/20 1519

## 2020-10-15 NOTE — Discharge Instructions (Addendum)
Advised patient may take medication as directed.  If headache/migraine symptoms persist, worsen and or unresolved please follow-up with PCP or here for further evaluation.

## 2020-10-29 ENCOUNTER — Telehealth: Payer: Self-pay

## 2020-10-29 NOTE — Telephone Encounter (Signed)
Pharmacy calling to inform provider that the Victoza pens do not dial to 2.5mg  they only go to 1.8mg .  Pt's refill reads, 2.5mg , daily starting thu 11/12/20.  York Cerise would like a call back to know how to fill the medication for pt.  CB# [phone].  Please advise.

## 2020-12-16 ENCOUNTER — Encounter: Payer: Self-pay | Admitting: Nurse Practitioner

## 2020-12-17 ENCOUNTER — Other Ambulatory Visit: Payer: Self-pay | Admitting: Nurse Practitioner

## 2020-12-17 DIAGNOSIS — Z6841 Body Mass Index (BMI) 40.0 and over, adult: Secondary | ICD-10-CM

## 2020-12-17 DIAGNOSIS — E282 Polycystic ovarian syndrome: Secondary | ICD-10-CM

## 2020-12-17 DIAGNOSIS — R7303 Prediabetes: Secondary | ICD-10-CM

## 2020-12-21 ENCOUNTER — Telehealth (INDEPENDENT_AMBULATORY_CARE_PROVIDER_SITE_OTHER): Payer: 59 | Admitting: Nurse Practitioner

## 2020-12-21 ENCOUNTER — Other Ambulatory Visit: Payer: Self-pay | Admitting: Nurse Practitioner

## 2020-12-21 ENCOUNTER — Encounter: Payer: Self-pay | Admitting: Nurse Practitioner

## 2020-12-21 DIAGNOSIS — E282 Polycystic ovarian syndrome: Secondary | ICD-10-CM

## 2020-12-21 DIAGNOSIS — R7303 Prediabetes: Secondary | ICD-10-CM

## 2020-12-21 DIAGNOSIS — Z6841 Body Mass Index (BMI) 40.0 and over, adult: Secondary | ICD-10-CM | POA: Diagnosis not present

## 2020-12-21 DIAGNOSIS — E66813 Obesity, class 3: Secondary | ICD-10-CM

## 2020-12-21 MED ORDER — SAXENDA 18 MG/3ML ~~LOC~~ SOPN: 2.4000 mg | PEN_INJECTOR | Freq: Every day | SUBCUTANEOUS | 2 refills | Status: DC

## 2020-12-21 NOTE — Progress Notes (Signed)
Virtual Visit via Video Note  I connected withNAME@ on 12/21/20 at  8:00 AM EDT by a video enabled telemedicine application and verified that I am speaking with the correct person using two identifiers.  Location: Patient:Home Provider: Office Participants: patient and provider  I discussed the limitations of evaluation and management by telemedicine and the availability of in person appointments. I also discussed with the patient that there may be a patient responsible charge related to this service. The patient expressed understanding and agreed to proceed.  JG:GEZMOQ loss managemant  History of Present Illness: Class 3 severe obesity due to excess calories with serious comorbidity and body mass index (BMI) of 40.0 to 44.9 in adult (HCC) No weight loss in last 37months. Current use of Victoza 1.8mg  daily. Denies any adverse side effects with medication She has diet changes (low carb and low fat, decreased portion sizes) She is also walking daily. Did not schedule appt with weight and wellness clinic due to work schedule Wt Readings from Last 3 Encounters:  12/21/20 232 lb (105.2 kg)  10/01/20 232 lb 9.6 oz (105.5 kg)  08/14/20 233 lb 6.4 oz (105.9 kg)   Change to Saxenda and increase dose to 2.4mg  daily x 2weeks, then 3mg  daily continuously. F/up in 29months   Observations/Objective: Physical Exam Constitutional:      Appearance: She is obese.  Pulmonary:     Effort: Pulmonary effort is normal.  Neurological:     Mental Status: She is alert and oriented to person, place, and time.   Assessment and Plan: Angeli was seen today for follow-up.  Diagnoses and all orders for this visit:  Class 3 severe obesity due to excess calories with serious comorbidity and body mass index (BMI) of 40.0 to 44.9 in adult (HCC) -     Liraglutide -Weight Management (SAXENDA) 18 MG/3ML SOPN; Inject 2.4 mg into the skin daily at 12 noon.  Polycystic ovaries -     Liraglutide -Weight  Management (SAXENDA) 18 MG/3ML SOPN; Inject 2.4 mg into the skin daily at 12 noon.  Prediabetes -     Liraglutide -Weight Management (SAXENDA) 18 MG/3ML SOPN; Inject 2.4 mg into the skin daily at 12 noon.   Follow Up Instructions: See above   I discussed the assessment and treatment plan with the patient. The patient was provided an opportunity to ask questions and all were answered. The patient agreed with the plan and demonstrated an understanding of the instructions.   The patient was advised to call back or seek an in-person evaluation if the symptoms worsen or if the condition fails to improve as anticipated.  Sue Lush, NP

## 2020-12-21 NOTE — Assessment & Plan Note (Addendum)
No weight loss in last 34months. Current use of Victoza 1.8mg  daily. Denies any adverse side effects with medication She has diet changes (low carb and low fat, decreased portion sizes) She is also walking daily. Did not schedule appt with weight and wellness clinic due to work schedule Wt Readings from Last 3 Encounters:  12/21/20 232 lb (105.2 kg)  10/01/20 232 lb 9.6 oz (105.5 kg)  08/14/20 233 lb 6.4 oz (105.9 kg)   Change to Saxenda and increase dose to 2.4mg  daily x 2weeks, then 3mg  daily continuously. F/up in 60months

## 2020-12-25 ENCOUNTER — Telehealth: Payer: Self-pay | Admitting: Nurse Practitioner

## 2020-12-25 NOTE — Telephone Encounter (Signed)
Pt has sent a mychart message in on 12/21/20 that pertains to this message. Bernie Covey is over $300 to get filled. She is wanting to get involved with a Patient Assistance Program that will greatly help with the cost of this script. She has the form in a pdf along with her message. If you are able to fill this out for her, she can get her finical statement to [place]. It has to be faxed over with  the form from the pdf. If you have any further question please advise pt at [phone].

## 2020-12-27 LAB — HM PAP SMEAR

## 2021-01-02 ENCOUNTER — Other Ambulatory Visit: Payer: Self-pay | Admitting: Nurse Practitioner

## 2021-01-02 DIAGNOSIS — F321 Major depressive disorder, single episode, moderate: Secondary | ICD-10-CM

## 2021-01-02 DIAGNOSIS — F411 Generalized anxiety disorder: Secondary | ICD-10-CM

## 2021-01-05 NOTE — Telephone Encounter (Signed)
Refill request for: Venlafaxine 75 mg LR  12/12/19,#90, 3 rf LOV 12/24/20 (weight loss) FOV  none scheduled.    Please review and advise.  Thanks. Dm/cma

## 2021-04-05 ENCOUNTER — Other Ambulatory Visit: Payer: Self-pay | Admitting: Nurse Practitioner

## 2021-04-05 DIAGNOSIS — F321 Major depressive disorder, single episode, moderate: Secondary | ICD-10-CM

## 2021-04-05 DIAGNOSIS — F411 Generalized anxiety disorder: Secondary | ICD-10-CM

## 2021-04-07 NOTE — Telephone Encounter (Signed)
Chart supports Rx. Last seen 12/2020 Next OV 05/19/2021 Pt informed appointment would need to be completed in order for additional refills, she verbalized understanding.

## 2021-05-19 ENCOUNTER — Ambulatory Visit: Payer: 59 | Admitting: Nurse Practitioner

## 2021-05-19 ENCOUNTER — Encounter: Payer: Self-pay | Admitting: Nurse Practitioner

## 2021-05-19 ENCOUNTER — Other Ambulatory Visit: Payer: Self-pay

## 2021-05-19 ENCOUNTER — Other Ambulatory Visit: Payer: Self-pay | Admitting: Nurse Practitioner

## 2021-05-19 VITALS — BP 110/80 | HR 82 | Temp 96.8°F | Ht 61.0 in | Wt 237.8 lb

## 2021-05-19 DIAGNOSIS — E66813 Obesity, class 3: Secondary | ICD-10-CM

## 2021-05-19 DIAGNOSIS — F321 Major depressive disorder, single episode, moderate: Secondary | ICD-10-CM

## 2021-05-19 DIAGNOSIS — Z6841 Body Mass Index (BMI) 40.0 and over, adult: Secondary | ICD-10-CM

## 2021-05-19 DIAGNOSIS — F411 Generalized anxiety disorder: Secondary | ICD-10-CM

## 2021-05-19 DIAGNOSIS — R7303 Prediabetes: Secondary | ICD-10-CM

## 2021-05-19 DIAGNOSIS — B360 Pityriasis versicolor: Secondary | ICD-10-CM | POA: Diagnosis not present

## 2021-05-19 DIAGNOSIS — E782 Mixed hyperlipidemia: Secondary | ICD-10-CM | POA: Diagnosis not present

## 2021-05-19 DIAGNOSIS — E282 Polycystic ovarian syndrome: Secondary | ICD-10-CM

## 2021-05-19 DIAGNOSIS — E049 Nontoxic goiter, unspecified: Secondary | ICD-10-CM

## 2021-05-19 LAB — HEPATIC FUNCTION PANEL
ALT: 17 U/L (ref 0–35)
AST: 16 U/L (ref 0–37)
Albumin: 4.3 g/dL (ref 3.5–5.2)
Alkaline Phosphatase: 63 U/L (ref 39–117)
Bilirubin, Direct: 0.1 mg/dL (ref 0.0–0.3)
Total Bilirubin: 0.4 mg/dL (ref 0.2–1.2)
Total Protein: 7.9 g/dL (ref 6.0–8.3)

## 2021-05-19 LAB — LIPID PANEL
Cholesterol: 225 mg/dL — ABNORMAL HIGH (ref 0–200)
HDL: 43.9 mg/dL (ref >39.00)
LDL Cholesterol: 166 mg/dL — ABNORMAL HIGH (ref 0–99)
NonHDL: 180.74
Total CHOL/HDL Ratio: 5
Triglycerides: 75 mg/dL (ref 0.0–149.0)
VLDL: 15 mg/dL (ref 0.0–40.0)

## 2021-05-19 LAB — TSH: TSH: 2.28 u[IU]/mL (ref 0.35–5.50)

## 2021-05-19 LAB — HEMOGLOBIN A1C: Hgb A1c MFr Bld: 5.4 % (ref 4.6–6.5)

## 2021-05-19 MED ORDER — VICTOZA 18 MG/3ML ~~LOC~~ SOPN: 1.8000 mg | PEN_INJECTOR | Freq: Every day | SUBCUTANEOUS | 0 refills | Status: DC

## 2021-05-19 MED ORDER — FLUCONAZOLE 150 MG PO TABS: 150.0000 mg | ORAL_TABLET | ORAL | 0 refills | Status: DC

## 2021-05-19 MED ORDER — BD PEN NEEDLE MINI U/F 31G X 5 MM MISC: 5 refills | Status: DC

## 2021-05-19 MED ORDER — VENLAFAXINE HCL ER 75 MG PO CP24: 75.0000 mg | ORAL_CAPSULE | Freq: Every day | ORAL | 3 refills | Status: DC

## 2021-05-19 MED ORDER — FLUCONAZOLE 100 MG PO TABS: 100.0000 mg | ORAL_TABLET | ORAL | 0 refills | Status: DC

## 2021-05-19 NOTE — Assessment & Plan Note (Signed)
Waxing and waning, but stable Current use of effexor and weekly sessions with therapist. Maintain med dose F/up in 42month

## 2021-05-19 NOTE — Assessment & Plan Note (Signed)
Repeat hgbA1c 

## 2021-05-19 NOTE — Assessment & Plan Note (Signed)
victoza discontinued after 29month due to high copay. Reports weight loss of 5lbs during that periods. She has a new plan and had lower copay, hence will like to resume victoza injection. saxenda is till not covered by her insurance. Exercise: 2-4x/week (cardio and weight training) Diet: continues to struggle with stress eating. Agreed to nutritionist referral.  Check CMP, TSH, and hgbA1c Resume victoza at 1.8mg  daily. Entered referral to nutritionist. F/up in 57month

## 2021-05-19 NOTE — Patient Instructions (Addendum)
Let me know if any of the following medications are covered for prediabetes management: pzempic and Mounjaro.  Resume Victoza at 1.8mg  daily  Go to lab for blood draw.  You will be contacted to schedule appt with nutritionist.  Calorie Counting for Weight Loss Calories are units of energy. Your body needs a certain number of calories from food to keep going throughout the day. When you eat or drink more calories than your body needs, your body stores the extra calories mostly as fat. When you eat or drink fewer calories than your body needs, your body burns fat to get the energy it needs. Calorie counting means keeping track of how many calories you eat and drink each day. Calorie counting can be helpful if you need to lose weight. If you eat fewer calories than your body needs, you should lose weight. Ask your health care provider what a healthy weight is for you. For calorie counting to work, you will need to eat the right number of calories each day to lose a healthy amount of weight per week. A dietitian can help you figure out how many calories you need in a day and will suggest ways to reach your calorie goal. A healthy amount of weight to lose each week is usually 1-2 lb (0.5-0.9 kg). This usually means that your daily calorie intake should be reduced by 500-750 calories. Eating 1,200-1,500 calories a day can help most women lose weight. Eating 1,500-1,800 calories a day can help most men lose weight. What do I need to know about calorie counting? Work with your health care provider or dietitian to determine how many calories you should get each day. To meet your daily calorie goal, you will need to: Find out how many calories are in each food that you would like to eat. Try to do this before you eat. Decide how much of the food you plan to eat. Keep a food log. Do this by writing down what you ate and how many calories it had. To successfully lose weight, it is important to balance  calorie counting with a healthy lifestyle that includes regular activity. Where do I find calorie information? The number of calories in a food can be found on a Nutrition Facts label. If a food does not have a Nutrition Facts label, try to look up the calories online or ask your dietitian for help. Remember that calories are listed per serving. If you choose to have more than one serving of a food, you will have to multiply the calories per serving by the number of servings you plan to eat. For example, the label on a package of bread might say that a serving size is 1 slice and that there are 90 calories in a serving. If you eat 1 slice, you will have eaten 90 calories. If you eat 2 slices, you will have eaten 180 calories. How do I keep a food log? After each time that you eat, record the following in your food log as soon as possible: What you ate. Be sure to include toppings, sauces, and other extras on the food. How much you ate. This can be measured in cups, ounces, or number of items. How many calories were in each food and drink. The total number of calories in the food you ate. Keep your food log near you, such as in a pocket-sized notebook or on an app or website on your mobile phone. Some programs will calculate calories for you and  show you how many calories you have left to meet your daily goal. What are some portion-control tips? Know how many calories are in a serving. This will help you know how many servings you can have of a certain food. Use a measuring cup to measure serving sizes. You could also try weighing out portions on a kitchen scale. With time, you will be able to estimate serving sizes for some foods. Take time to put servings of different foods on your favorite plates or in your favorite bowls and cups so you know what a serving looks like. Try not to eat straight from a food's packaging, such as from a bag or box. Eating straight from the package makes it hard to see  how much you are eating and can lead to overeating. Put the amount you would like to eat in a cup or on a plate to make sure you are eating the right portion. Use smaller plates, glasses, and bowls for smaller portions and to prevent overeating. Try not to multitask. For example, avoid watching TV or using your computer while eating. If it is time to eat, sit down at a table and enjoy your food. This will help you recognize when you are full. It will also help you be more mindful of what and how much you are eating. What are tips for following this plan? Reading food labels Check the calorie count compared with the serving size. The serving size may be smaller than what you are used to eating. Check the source of the calories. Try to choose foods that are high in protein, fiber, and vitamins, and low in saturated fat, trans fat, and sodium. Shopping Read nutrition labels while you shop. This will help you make healthy decisions about which foods to buy. Pay attention to nutrition labels for low-fat or fat-free foods. These foods sometimes have the same number of calories or more calories than the full-fat versions. They also often have added sugar, starch, or salt to make up for flavor that was removed with the fat. Make a grocery list of lower-calorie foods and stick to it. Cooking Try to cook your favorite foods in a healthier way. For example, try baking instead of frying. Use low-fat dairy products. Meal planning Use more fruits and vegetables. One-half of your plate should be fruits and vegetables. Include lean proteins, such as chicken, [place], and fish. Lifestyle Each week, aim to do one of the following: 150 minutes of moderate exercise, such as walking. 75 minutes of vigorous exercise, such as running. General information Know how many calories are in the foods you eat most often. This will help you calculate calorie counts faster. Find a way of tracking calories that works for you.  Get creative. Try different apps or programs if writing down calories does not work for you. What foods should I eat?  Eat nutritious foods. It is better to have a nutritious, high-calorie food, such as an avocado, than a food with few nutrients, such as a bag of potato chips. Use your calories on foods and drinks that will fill you up and will not leave you hungry soon after eating. Examples of foods that fill you up are nuts and nut butters, vegetables, lean proteins, and high-fiber foods such as whole grains. High-fiber foods are foods with more than 5 g of fiber per serving. Pay attention to calories in drinks. Low-calorie drinks include water and unsweetened drinks. The items listed above may not be a complete list  of foods and beverages you can eat. Contact a dietitian for more information. What foods should I limit? Limit foods or drinks that are not good sources of vitamins, minerals, or protein or that are high in unhealthy fats. These include: Candy. Other sweets. Sodas, specialty coffee drinks, alcohol, and juice. The items listed above may not be a complete list of foods and beverages you should avoid. Contact a dietitian for more information. How do I count calories when eating out? Pay attention to portions. Often, portions are much larger when eating out. Try these tips to keep portions smaller: Consider sharing a meal instead of getting your own. If you get your own meal, eat only half of it. Before you start eating, ask for a container and put half of your meal into it. When available, consider ordering smaller portions from the menu instead of full portions. Pay attention to your food and drink choices. Knowing the way food is cooked and what is included with the meal can help you eat fewer calories. If calories are listed on the menu, choose the lower-calorie options. Choose dishes that include vegetables, fruits, whole grains, low-fat dairy products, and lean  proteins. Choose items that are boiled, broiled, grilled, or steamed. Avoid items that are buttered, battered, fried, or served with cream sauce. Items labeled as crispy are usually fried, unless stated otherwise. Choose water, low-fat milk, unsweetened iced tea, or other drinks without added sugar. If you want an alcoholic beverage, choose a lower-calorie option, such as a glass of wine or light beer. Ask for dressings, sauces, and syrups on the side. These are usually high in calories, so you should limit the amount you eat. If you want a salad, choose a garden salad and ask for grilled meats. Avoid extra toppings such as bacon, cheese, or fried items. Ask for the dressing on the side, or ask for olive oil and vinegar or lemon to use as dressing. Estimate how many servings of a food you are given. Knowing serving sizes will help you be aware of how much food you are eating at restaurants. Where to find more information Centers for Disease Control and Prevention: FootballExhibition.com.br U.[place]. Department of Agriculture: WrestlingReporter.dk Summary Calorie counting means keeping track of how many calories you eat and drink each day. If you eat fewer calories than your body needs, you should lose weight. A healthy amount of weight to lose per week is usually 1-2 lb (0.5-0.9 kg). This usually means reducing your daily calorie intake by 500-750 calories. The number of calories in a food can be found on a Nutrition Facts label. If a food does not have a Nutrition Facts label, try to look up the calories online or ask your dietitian for help. Use smaller plates, glasses, and bowls for smaller portions and to prevent overeating. Use your calories on foods and drinks that will fill you up and not leave you hungry shortly after a meal. This information is not intended to replace advice given to you by your health care provider. Make sure you discuss any questions you have with your health care provider. Document Revised:  06/06/2019 Document Reviewed: 06/06/2019 Elsevier Patient Education  2022 [employer].

## 2021-05-19 NOTE — Assessment & Plan Note (Signed)
Repeat lipid panel ?

## 2021-05-19 NOTE — Progress Notes (Signed)
Subjective:  Patient ID: Leslie Bell, female    DOB: Sep 08, 1990  Age: 31 y.o. MRN: [id number]  CC: Follow-up (Follow up on prediabetes and weight. Pt is fasting./Medication refills needed. )  Rash This is a chronic problem. The current episode started more than 1 year ago. The problem has been waxing and waning since onset. The affected locations include the torso, left arm and right arm. The rash is characterized by scaling, itchiness and dryness. She was exposed to nothing. Pertinent negatives include no fatigue, fever, joint pain or nail changes. Past treatments include moisturizer and anti-itch cream. The treatment provided no relief. There is no history of allergies or eczema.  No improvement with clotrimazole.  Depression, major, single episode, moderate (HCC) Waxing and waning, but stable Current use of effexor and weekly sessions with therapist. Maintain med dose F/up in 94month  Mixed hyperlipidemia Repeat lipid panel  Prediabetes Repeat hgbA1c  Class 3 severe obesity due to excess calories with serious comorbidity and body mass index (BMI) of 40.0 to 44.9 in adult [facility]) victoza discontinued after 5month due to high copay. Reports weight loss of 5lbs during that periods. She has a new plan and had lower copay, hence will like to resume victoza injection. saxenda is till not covered by her insurance. Exercise: 2-4x/week (cardio and weight training) Diet: continues to struggle with stress eating. Agreed to nutritionist referral.  Check CMP, TSH, and hgbA1c Resume victoza at 1.8mg  daily. Entered referral to nutritionist. F/up in 57month   Wt Readings from Last 3 Encounters:  05/19/21 237 lb 12.8 oz (107.9 kg)  12/21/20 232 lb (105.2 kg)  10/01/20 232 lb 9.6 oz (105.5 kg)    Reviewed past Medical, Social and Family history today.  Outpatient Medications Prior to Visit  Medication Sig Dispense Refill   Cholecalciferol (VITAMIN D3) 125 MCG (5000 UT) CAPS Take 1  capsule (5,000 Units total) by mouth daily. 90 capsule 0   Insulin Pen Needle (B-D UF III MINI PEN NEEDLES) 31G X 5 MM MISC Use as directed with Victoza daily. 100 each 3   Levonorgestrel-Ethinyl Estradiol (AMETHIA) 0.15-0.03 &0.01 MG tablet Take 1 tablet by mouth daily.     metFORMIN (GLUCOPHAGE-XR) 750 MG 24 hr tablet Take 750 mg by mouth daily with breakfast.     venlafaxine XR (EFFEXOR-XR) 75 MG 24 hr capsule TAKE 1 CAPSULE (75 MG TOTAL) BY MOUTH DAILY WITH BREAKFAST. NEED OFFICE VISIT FOR ADDITIONAL REFILLS 90 capsule 0   SUMAtriptan (IMITREX) 50 MG tablet Take 1 tablet (50 mg total) by mouth daily for 10 doses. May repeat in 2 hours if headache persists or recurs. 10 tablet 0   liraglutide (VICTOZA) 18 MG/3ML SOPN Inject 2.4 mg into the skin daily. (Patient not taking: Reported on 05/19/2021) 15 mL 2   Facility-Administered Medications Prior to Visit  Medication Dose Route Frequency Provider Last Rate Last Admin   methylPREDNISolone acetate (DEPO-MEDROL) injection 40 mg  40 mg Intramuscular Once Kaedence Connelly, Charlene Brooke, NP        ROS See HPI  Objective:  BP 110/80 (BP Location: Left Arm, Patient Position: Sitting, Cuff Size: Large)    Pulse 82    Temp (!) 96.8 F (36 C) (Temporal)    Ht 5\' 1"  (1.549 m)    Wt 237 lb 12.8 oz (107.9 kg)    SpO2 98%    BMI 44.93 kg/m   Physical Exam Constitutional:      Appearance: She is obese.  Cardiovascular:  Rate and Rhythm: Normal rate and regular rhythm.     Pulses: Normal pulses.     Heart sounds: Normal heart sounds.  Pulmonary:     Effort: Pulmonary effort is normal.     Breath sounds: Normal breath sounds.  Abdominal:     General: Bowel sounds are normal.     Palpations: Abdomen is soft.  Skin:    General: Skin is warm and dry.     Findings: Rash present.  Neurological:     Mental Status: She is alert and oriented to person, place, and time.  Psychiatric:        Mood and Affect: Mood is anxious.        Speech: Speech normal.         Behavior: Behavior normal.        Thought Content: Thought content normal.        Cognition and Memory: Cognition and memory normal.   Assessment & Plan:  This visit occurred during the SARS-CoV-2 public health emergency.  Safety protocols were in place, including screening questions prior to the visit, additional usage of staff PPE, and extensive cleaning of exam room while observing appropriate contact time as indicated for disinfecting solutions.   Saphia was seen today for follow-up.  Diagnoses and all orders for this visit:  Class 3 severe obesity due to excess calories with serious comorbidity and body mass index (BMI) of 40.0 to 44.9 in adult [facility]) -     Hepatic function panel -     liraglutide (VICTOZA) 18 MG/3ML SOPN; Inject 1.8 mg into the skin daily.  Prediabetes -     Hemoglobin A1c -     Referral to Nutrition and Diabetes Services -     liraglutide (VICTOZA) 18 MG/3ML SOPN; Inject 1.8 mg into the skin daily.  Goiter -     TSH  Tinea versicolor -     fluconazole (DIFLUCAN) 150 MG tablet; Take 1 tablet (150 mg total) by mouth 3 (three) times a week.  Mixed hyperlipidemia -     Hepatic function panel -     Lipid panel  Polycystic ovaries -     Referral to Nutrition and Diabetes Services -     liraglutide (VICTOZA) 18 MG/3ML SOPN; Inject 1.8 mg into the skin daily.  Depression, major, single episode, moderate (HCC) -     venlafaxine XR (EFFEXOR-XR) 75 MG 24 hr capsule; Take 1 capsule (75 mg total) by mouth daily with breakfast.  GAD (generalized anxiety disorder) -     venlafaxine XR (EFFEXOR-XR) 75 MG 24 hr capsule; Take 1 capsule (75 mg total) by mouth daily with breakfast.   Problem List Items Addressed This Visit       Endocrine   Goiter   Relevant Orders   TSH   Polycystic ovaries   Relevant Medications   liraglutide (VICTOZA) 18 MG/3ML SOPN   Other Relevant Orders   Referral to Nutrition and Diabetes Services     Musculoskeletal and Integument    Tinea versicolor   Relevant Medications   fluconazole (DIFLUCAN) 150 MG tablet     Other   Class 3 severe obesity due to excess calories with serious comorbidity and body mass index (BMI) of 40.0 to 44.9 in adult [facility]) - Primary    victoza discontinued after 40month due to high copay. Reports weight loss of 5lbs during that periods. She has a new plan and had lower copay, hence will like to resume victoza injection. saxenda is  till not covered by her insurance. Exercise: 2-4x/week (cardio and weight training) Diet: continues to struggle with stress eating. Agreed to nutritionist referral.  Check CMP, TSH, and hgbA1c Resume victoza at 1.8mg  daily. Entered referral to nutritionist. F/up in 21month       Relevant Medications   metFORMIN (GLUCOPHAGE-XR) 750 MG 24 hr tablet   liraglutide (VICTOZA) 18 MG/3ML SOPN   Other Relevant Orders   Hepatic function panel   Depression, major, single episode, moderate (HCC)    Waxing and waning, but stable Current use of effexor and weekly sessions with therapist. Maintain med dose F/up in 43month      Relevant Medications   venlafaxine XR (EFFEXOR-XR) 75 MG 24 hr capsule   GAD (generalized anxiety disorder)   Relevant Medications   venlafaxine XR (EFFEXOR-XR) 75 MG 24 hr capsule   Mixed hyperlipidemia    Repeat lipid panel      Relevant Orders   Hepatic function panel   Lipid panel   Prediabetes    Repeat hgbA1c      Relevant Medications   liraglutide (VICTOZA) 18 MG/3ML SOPN   Other Relevant Orders   Hemoglobin A1c   Referral to Nutrition and Diabetes Services     Follow-up: Return in about 4 weeks (around 06/16/2021) for weight management.  Wilfred Lacy, NP

## 2021-06-18 ENCOUNTER — Other Ambulatory Visit: Payer: Self-pay | Admitting: Nurse Practitioner

## 2021-06-18 DIAGNOSIS — E282 Polycystic ovarian syndrome: Secondary | ICD-10-CM

## 2021-06-18 DIAGNOSIS — R7303 Prediabetes: Secondary | ICD-10-CM

## 2021-06-18 NOTE — Telephone Encounter (Signed)
Please advise message below patient not able to come in until 07/08/21. Would like a refill prior to appointment.

## 2021-06-18 NOTE — Telephone Encounter (Signed)
[place] and asked if this could please be filled today. Please advise

## 2021-07-08 ENCOUNTER — Other Ambulatory Visit: Payer: Self-pay | Admitting: Nurse Practitioner

## 2021-07-08 ENCOUNTER — Encounter: Payer: Self-pay | Admitting: Nurse Practitioner

## 2021-07-08 ENCOUNTER — Ambulatory Visit: Payer: 59 | Admitting: Nurse Practitioner

## 2021-07-08 ENCOUNTER — Other Ambulatory Visit: Payer: Self-pay

## 2021-07-08 VITALS — BP 120/82 | HR 90 | Temp 97.9°F | Ht 61.0 in | Wt 236.6 lb

## 2021-07-08 DIAGNOSIS — R7303 Prediabetes: Secondary | ICD-10-CM

## 2021-07-08 DIAGNOSIS — E282 Polycystic ovarian syndrome: Secondary | ICD-10-CM

## 2021-07-08 DIAGNOSIS — Z6841 Body Mass Index (BMI) 40.0 and over, adult: Secondary | ICD-10-CM | POA: Diagnosis not present

## 2021-07-08 MED ORDER — VICTOZA 18 MG/3ML ~~LOC~~ SOPN: 2.4000 mg | PEN_INJECTOR | SUBCUTANEOUS | 1 refills | Status: DC

## 2021-07-08 MED ORDER — VICTOZA 18 MG/3ML ~~LOC~~ SOPN: 2.4000 mg | PEN_INJECTOR | Freq: Every day | SUBCUTANEOUS | 0 refills | Status: DC

## 2021-07-08 NOTE — Progress Notes (Signed)
? ?Subjective:  ?Patient ID: Leslie Bell, female    DOB: 09-03-1990  Age: 31 y.o. MRN: [id number] ? ?CC: Follow-up (1 month f//u on weight management. ) ? ?HPI ? ?Class 3 severe obesity due to excess calories with serious comorbidity and body mass index (BMI) of 40.0 to 44.9 in adult [facility]) ?1lb weight loss in last 43month. ?Current use of victoza 1.8mg  daily ?She denies any adverse side effects. ?Wt Readings from Last 3 Encounters:  ?07/08/21 236 lb 9.6 oz (107.3 kg)  ?05/19/21 237 lb 12.8 oz (107.9 kg)  ?12/21/20 232 lb (105.2 kg)  ? ?Increase victoza to 2.4mg  daily ?F/up in 45month. Will repeat labs then ?Advised to incorporate regular exercise regimen. Provided printed information ? ?Reviewed past Medical, Social and Family history today. ? ?Outpatient Medications Prior to Visit  ?Medication Sig Dispense Refill  ? Cholecalciferol (VITAMIN D3) 125 MCG (5000 UT) CAPS Take 1 capsule (5,000 Units total) by mouth daily. 90 capsule 0  ? Insulin Pen Needle (B-D UF III MINI PEN NEEDLES) 31G X 5 MM MISC Use as directed with Victoza daily. 30 each 5  ? Levonorgestrel-Ethinyl Estradiol (AMETHIA) 0.15-0.03 &0.01 MG tablet Take 1 tablet by mouth daily.    ? metFORMIN (GLUCOPHAGE-XR) 750 MG 24 hr tablet Take 750 mg by mouth daily with breakfast.    ? venlafaxine XR (EFFEXOR-XR) 75 MG 24 hr capsule Take 1 capsule (75 mg total) by mouth daily with breakfast. 90 capsule 3  ? VICTOZA 18 MG/3ML SOPN INJECT 1.8 MG UNDER THE SKIN ONCE DAILY 3 mL 1  ? SUMAtriptan (IMITREX) 50 MG tablet Take 1 tablet (50 mg total) by mouth daily for 10 doses. May repeat in 2 hours if headache persists or recurs. 10 tablet 0  ? fluconazole (DIFLUCAN) 100 MG tablet Take 1 tablet (100 mg total) by mouth 3 (three) times a week. (Patient not taking: Reported on 07/08/2021) 6 tablet 0  ? ?Facility-Administered Medications Prior to Visit  ?Medication Dose Route Frequency Provider Last Rate Last Admin  ? methylPREDNISolone acetate (DEPO-MEDROL) injection 40 mg   40 mg Intramuscular Once Dhara Schepp, Bonna Gains, NP      ? ? ?ROS ?See HPI ? ?Objective:  ?BP 120/82 (BP Location: Left Arm, Patient Position: Sitting, Cuff Size: Large)   Pulse 90   Temp 97.9 ?F (36.6 ?C) (Temporal)   Ht 5\' 1"  (1.549 m)   Wt 236 lb 9.6 oz (107.3 kg)   SpO2 99%   BMI 44.71 kg/m?  ? ?Physical Exam ?Constitutional:   ?   Appearance: She is obese.  ?Cardiovascular:  ?   Rate and Rhythm: Normal rate and regular rhythm.  ?   Pulses: Normal pulses.  ?   Heart sounds: Normal heart sounds.  ?Pulmonary:  ?   Effort: Pulmonary effort is normal.  ?   Breath sounds: Normal breath sounds.  ?Abdominal:  ?   General: There is no distension.  ?   Tenderness: There is no abdominal tenderness.  ?Neurological:  ?   Mental Status: She is alert and oriented to person, place, and time.  ? ?Assessment & Plan:  ?This visit occurred during the SARS-CoV-2 public health emergency.  Safety protocols were in place, including screening questions prior to the visit, additional usage of staff PPE, and extensive cleaning of exam room while observing appropriate contact time as indicated for disinfecting solutions.  ? ?Tajai was seen today for follow-up. ? ?Diagnoses and all orders for this visit: ? ?Class 3 severe obesity due  to excess calories with serious comorbidity and body mass index (BMI) of 40.0 to 44.9 in adult [facility]) ?-     Discontinue: liraglutide (VICTOZA) 18 MG/3ML SOPN; Inject 2.4 mg into the skin once a week. ?-     liraglutide (VICTOZA) 18 MG/3ML SOPN; Inject 2.4 mg into the skin daily. ? ?Prediabetes ?-     Discontinue: liraglutide (VICTOZA) 18 MG/3ML SOPN; Inject 2.4 mg into the skin once a week. ?-     liraglutide (VICTOZA) 18 MG/3ML SOPN; Inject 2.4 mg into the skin daily. ? ?Polycystic ovaries ?-     Discontinue: liraglutide (VICTOZA) 18 MG/3ML SOPN; Inject 2.4 mg into the skin once a week. ?-     liraglutide (VICTOZA) 18 MG/3ML SOPN; Inject 2.4 mg into the skin daily. ? ? ?Problem List Items Addressed This  Visit   ? ?  ? Endocrine  ? Polycystic ovaries  ? Relevant Medications  ? liraglutide (VICTOZA) 18 MG/3ML SOPN  ?  ? Other  ? Class 3 severe obesity due to excess calories with serious comorbidity and body mass index (BMI) of 40.0 to 44.9 in adult [facility]) - Primary  ?  1lb weight loss in last 46month. ?Current use of victoza 1.8mg  daily ?She denies any adverse side effects. ?Wt Readings from Last 3 Encounters:  ?07/08/21 236 lb 9.6 oz (107.3 kg)  ?05/19/21 237 lb 12.8 oz (107.9 kg)  ?12/21/20 232 lb (105.2 kg)  ? ?Increase victoza to 2.4mg  daily ?F/up in 20month. Will repeat labs then ?Advised to incorporate regular exercise regimen. Provided printed information ?  ?  ? Relevant Medications  ? liraglutide (VICTOZA) 18 MG/3ML SOPN  ? Prediabetes  ? Relevant Medications  ? liraglutide (VICTOZA) 18 MG/3ML SOPN  ?  ?Follow-up: Return in about 4 weeks (around 08/05/2021) for prediabetes and weight management. ? ?Alysia Penna, NP ?

## 2021-07-08 NOTE — Patient Instructions (Signed)
Increase dose to 2.4mg  daily ?Maintain heart healthy diet, small meal portions and regulae exercise. ? ?How to Increase Your Level of Physical Activity ?Getting regular physical activity is important for your overall health and well-being. Most people do not get enough exercise. There are easy ways to increase your level of physical activity, even if you have not been very active in the past or if you are just starting out. ?What are the benefits of physical activity? ?Physical activity has many short-term and long-term benefits. Being active on a regular basis can improve your physical and mental health as well as provide other benefits. ?Physical health benefits ?Helping you lose weight or maintain a healthy weight. ?Strengthening your muscles and bones. ?Reducing your risk of certain long-term (chronic) diseases, including heart disease, cancer, and diabetes. ?Being able to move around more easily and for longer periods of time without getting tired (increased endurance or stamina). ?Improving your ability to fight off illness (enhanced immunity). ?Being able to sleep better. ?Helping you stay healthy as you get older, including: ?Helping you stay mobile, or capable of walking and moving around. ?Preventing accidents, such as falls. ?Increasing life expectancy. ?Mental health benefits ?Boosting your mood and improving your self-esteem. ?Lowering your chance of having mental health problems, such as depression or anxiety. ?Helping you feel good about your body. ?Other benefits ?Finding new sources of fun and enjoyment. ?Meeting new people who share a common interest. ?Before you begin ?If you have a chronic illness or have not been active for a while, check with your health care provider about how to get started. Ask your health care provider what activities are safe for you. ?Start out slowly. Walking or doing some simple chair exercises is a good place to start, especially if you have not been active before or  for a long time. ?Set goals that you can work toward. Ask your health care provider how much exercise is best for you. In general, most adults should: ?Do moderate-intensity exercise for at least 150 minutes each week (30 minutes on most days of the week) or vigorous exercise for at least 75 minutes each week, or a combination of these. ?Moderate-intensity exercise can include walking at a quick pace, biking, yoga, water aerobics, or gardening. ?Vigorous exercise involves activities that take more effort, such as jogging or running, playing sports, swimming laps, or jumping rope. ?Do strength exercises on at least 2 days each week. This can include weight lifting, body weight exercises, and resistance-band exercises. ?How to be more physically active ?Make a plan ? ?Try to find activities that you enjoy. You are more likely to commit to an exercise routine if it does not feel like a chore. ?If you have bone or joint problems, choose low-impact exercises, like walking or swimming. ?Use these tips for being successful with an exercise plan: ?Find a workout partner for accountability. ?Join a group or class, such as an aerobics class, cycling class, or sports team. ?Make family time active. Go for a walk, bike, or swim. ?Include a variety of exercises each week. ?Consider using a fitness tracker, such as a mobile phone app or a device worn like a watch, that will count the number of steps you take each day. Many people strive to reach 10,000 steps a day. ?Find ways to be active in your daily routines ?Besides your formal exercise plans, you can find ways to do physical activity during your daily routines, such as: ?Walking or biking to work or to the  store. ?Taking the stairs instead of the elevator. ?Parking farther away from the door at work or at the store. ?Planning walking meetings. ?Walking around while you are on the phone. ?Where to find more information ?Centers for Disease Control and Prevention:  CampusCasting.com.pt ?[employer] on [employer] Nutrition: www.fitness.gov ?ChooseMyPlate: http://www.harvey.com/ ?Contact a health care provider if: ?You have headaches, muscle aches, or joint pain that is concerning. ?You feel dizzy or light-headed while exercising. ?You faint. ?You feel your heart skipping, racing, or fluttering. ?You have chest pain while exercising. ?Summary ?Exercise benefits your mind and body at any age, even if you are just starting out. ?If you have a chronic illness or have not been active for a while, check with your health care provider before increasing your physical activity. ?Choose activities that are safe and enjoyable for you. Ask your health care provider what activities are safe for you. ?Start slowly. Tell your health care provider if you have problems as you start to increase your activity level. ?This information is not intended to replace advice given to you by your health care provider. Make sure you discuss any questions you have with your health care provider. ?Document Revised: 08/21/2020 Document Reviewed: 08/21/2020 ?Elsevier Patient Education ? 2022 Elsevier Inc. ? ?

## 2021-07-08 NOTE — Assessment & Plan Note (Addendum)
1lb weight loss in last 84month. ?Current use of victoza 1.8mg  daily ?She denies any adverse side effects. ?Wt Readings from Last 3 Encounters:  ?07/08/21 236 lb 9.6 oz (107.3 kg)  ?05/19/21 237 lb 12.8 oz (107.9 kg)  ?12/21/20 232 lb (105.2 kg)  ? ?Increase victoza to 2.4mg  daily ?F/up in 64month. Will repeat labs then ?Advised to incorporate regular exercise regimen. Provided printed information ?

## 2021-07-12 ENCOUNTER — Ambulatory Visit (INDEPENDENT_AMBULATORY_CARE_PROVIDER_SITE_OTHER): Payer: 59

## 2021-07-12 ENCOUNTER — Ambulatory Visit
Admission: EM | Admit: 2021-07-12 | Discharge: 2021-07-12 | Disposition: A | Discharge status: Home / Self Care | Payer: 59 | Attending: Emergency Medicine | Admitting: Emergency Medicine

## 2021-07-12 ENCOUNTER — Other Ambulatory Visit: Payer: Self-pay

## 2021-07-12 DIAGNOSIS — A084 Viral intestinal infection, unspecified: Secondary | ICD-10-CM

## 2021-07-12 DIAGNOSIS — R1012 Left upper quadrant pain: Secondary | ICD-10-CM | POA: Diagnosis not present

## 2021-07-12 DIAGNOSIS — R197 Diarrhea, unspecified: Secondary | ICD-10-CM

## 2021-07-12 DIAGNOSIS — K9289 Other specified diseases of the digestive system: Secondary | ICD-10-CM | POA: Diagnosis not present

## 2021-07-12 DIAGNOSIS — R109 Unspecified abdominal pain: Secondary | ICD-10-CM

## 2021-07-12 DIAGNOSIS — R140 Abdominal distension (gaseous): Secondary | ICD-10-CM

## 2021-07-12 NOTE — ED Provider Notes (Signed)
[facility]    CSN: [id number] Arrival date & time: 07/12/21  1047    HISTORY   Chief Complaint  Patient presents with   Abdominal Pain   HPI Leslie Bell is a 31 y.o. female. Patient presents to urgent care today complaining of fever that began 4 days ago.  Patient states she also began to have diarrhea and abdominal pain in the same time.  Patient states has been taking Imodium and Kaopectate for her diarrhea which provides relief of the diarrhea however has noticed that she has been having increased gas since then.  Patient states that the gas feels like it builds up is a big bubble and then when she passes that the pain resolves.  Patient states this has been intermittently occurring over the past few days.  Patient states her abdominal pain increases after she eats or drinks anything.  Patient states she has resumed normal diet.  EMR reviewed, patient has a history of biliary colic as well as cholelithiasis that is nonobstructive.  Patient states that her symptoms are different today than they were when she went to the emergency room for those BX complaints.   Past Medical History:  Diagnosis Date   Depression    PCOS (polycystic ovarian syndrome)    Patient Active Problem List   Diagnosis Date Noted   Mixed hyperlipidemia 05/19/2021   Tinea versicolor 05/19/2021   Class 3 severe obesity due to excess calories with serious comorbidity and body mass index (BMI) of 40.0 to 44.9 in adult [facility]) 08/15/2020   Prediabetes 08/15/2020   Calculus of gallbladder without cholecystitis without obstruction 08/14/2020   Mid back pain, chronic 12/12/2019   Vitamin D deficiency 03/19/2019   Depression, major, single episode, moderate (HCC) 03/19/2019   GAD (generalized anxiety disorder) 03/19/2019   Goiter 03/19/2019   Polycystic ovaries 03/11/2019   No past surgical history on file. OB History   No obstetric history on file.    Home Medications    Prior to Admission  medications   Medication Sig Start Date End Date Taking? Authorizing Provider  Cholecalciferol (VITAMIN D3) 125 MCG (5000 UT) CAPS Take 1 capsule (5,000 Units total) by mouth daily. 10/01/20   Nche, Bonna Gains, NP  Insulin Pen Needle (B-D UF III MINI PEN NEEDLES) 31G X 5 MM MISC Use as directed with Victoza daily. 05/19/21   Nche, Bonna Gains, NP  Levonorgestrel-Ethinyl Estradiol (AMETHIA) 0.15-0.03 &0.01 MG tablet Take 1 tablet by mouth daily. 01/09/20   [provider]  liraglutide (VICTOZA) 18 MG/3ML SOPN Inject 2.4 mg into the skin daily. 07/08/21   Nche, Bonna Gains, NP  metFORMIN (GLUCOPHAGE-XR) 750 MG 24 hr tablet Take 750 mg by mouth daily with breakfast.    [provider]  SUMAtriptan (IMITREX) 50 MG tablet Take 1 tablet (50 mg total) by mouth daily for 10 doses. May repeat in 2 hours if headache persists or recurs. 10/15/20 10/25/20  Trevor Iha, FNP  venlafaxine XR (EFFEXOR-XR) 75 MG 24 hr capsule Take 1 capsule (75 mg total) by mouth daily with breakfast. 05/19/21   Nche, Bonna Gains, NP    Family History Family History  Problem Relation Age of Onset   Hypertension Mother    Hypertension Father    Alcohol abuse Father    Diabetes Maternal Grandmother    Kidney disease Maternal Grandmother    Stroke Maternal Grandmother    Stroke Maternal Grandfather    Drug abuse Paternal Grandmother    Heart disease Paternal  Grandmother    Cancer Paternal Grandfather 1       unknown   Social History Social History   Tobacco Use   Smoking status: Never   Smokeless tobacco: Never  Vaping Use   Vaping Use: Never used  Substance Use Topics   Alcohol use: Yes    Alcohol/week: 1.0 standard drink    Types: 1 Glasses of wine per week    Comment: once a week   Drug use: Never   Allergies   Patient has no known allergies.  Review of Systems Review of Systems Pertinent findings noted in history of present illness.   Physical Exam Triage Vital Signs ED Triage  Vitals  Enc Vitals Group     BP 03/05/21 0827 (!) 147/82     Pulse Rate 03/05/21 0827 72     Resp 03/05/21 0827 18     Temp 03/05/21 0827 98.3 F (36.8 C)     Temp Source 03/05/21 0827 Oral     SpO2 03/05/21 0827 98 %     Weight --      Height --      Head Circumference --      Peak Flow --      Pain Score 03/05/21 0826 5     Pain Loc --      Pain Edu? --      Excl. in GC? --   No data found.  Updated Vital Signs BP 99/70 (BP Location: Left Arm)    Pulse 87    Temp 98.8 F (37.1 C) (Oral)    Resp 19    LMP 07/07/2021    SpO2 97%   Physical Exam Vitals and nursing note reviewed.  Constitutional:      General: She is not in acute distress.    Appearance: Normal appearance. She is well-developed. She is obese. She is not ill-appearing.  HENT:     Head: Normocephalic and atraumatic.  Eyes:     General: Lids are normal.        Right eye: No discharge.        Left eye: No discharge.     Extraocular Movements: Extraocular movements intact.     Conjunctiva/sclera: Conjunctivae normal.     Right eye: Right conjunctiva is not injected.     Left eye: Left conjunctiva is not injected.  Neck:     Trachea: Trachea and phonation normal.  Cardiovascular:     Rate and Rhythm: Normal rate and regular rhythm.     Pulses: Normal pulses.     Heart sounds: Normal heart sounds. No murmur heard.   No friction rub. No gallop.  Pulmonary:     Effort: Pulmonary effort is normal. No accessory muscle usage, prolonged expiration or respiratory distress.     Breath sounds: Normal breath sounds. No stridor, decreased air movement or transmitted upper airway sounds. No decreased breath sounds, wheezing, rhonchi or rales.  Chest:     Chest wall: No tenderness.  Abdominal:     General: Abdomen is flat. Bowel sounds are normal. There is no distension.     Palpations: Abdomen is soft.     Tenderness: There is generalized abdominal tenderness. There is no right CVA tenderness or left CVA tenderness.      Hernia: No hernia is present.  Musculoskeletal:        General: Normal range of motion.     Cervical back: Normal range of motion and neck supple. Normal range of motion.  Lymphadenopathy:  Cervical: No cervical adenopathy.  Skin:    General: Skin is warm and dry.     Findings: No erythema or rash.  Neurological:     General: No focal deficit present.     Mental Status: She is alert and oriented to person, place, and time.  Psychiatric:        Mood and Affect: Mood normal.        Behavior: Behavior normal.    Visual Acuity Right Eye Distance:   Left Eye Distance:   Bilateral Distance:    Right Eye Near:   Left Eye Near:    Bilateral Near:     UC Couse / Diagnostics / Procedures:    EKG  Radiology DG Abd 1 View  Result Date: 07/12/2021 CLINICAL DATA:  Abdominal pain, bloating, and diarrhea for 4 days. EXAM: ABDOMEN - 1 VIEW COMPARISON:  None. FINDINGS: The bowel gas pattern is normal. No radio-opaque calculi or other significant radiographic abnormality are seen. IMPRESSION: Negative. Electronically Signed   By: Danae OrleansJohn A Stahl M.D.   On: 07/12/2021 13:23    Procedures Procedures (including critical care time)  UC Diagnoses / Final Clinical Impressions(s)   I have reviewed the triage vital signs and the nursing notes.  Pertinent labs & imaging results that were available during my care of the patient were reviewed by me and considered in my medical decision making (see chart for details).    Final diagnoses:  Abdominal pain, left upper quadrant  Abdominal bloating  Gas bloat syndrome  Diarrhea of presumed infectious origin  Viral gastroenteritis   Patient advised improved bowel regimen of high-fiber low-fat diet and exercise should improve her symptoms.  Recommend Gas-X or Mylanta to relieve gas pain.  Patient advised to follow-up if not improving.  ED Prescriptions   None    PDMP not reviewed this encounter.  Pending results:  Labs Reviewed - No data to  display  Medications Ordered in UC: Medications - No data to display  Disposition Upon Discharge:  Condition: stable for discharge home Home: take medications as prescribed; routine discharge instructions as discussed; follow up as advised.  Patient presented with an acute illness with associated systemic symptoms and significant discomfort requiring urgent management. In my opinion, this is a condition that a prudent lay person (someone who possesses an average knowledge of health and medicine) may potentially expect to result in complications if not addressed urgently such as respiratory distress, impairment of bodily function or dysfunction of bodily organs.   Routine symptom specific, illness specific and/or disease specific instructions were discussed with the patient and/or caregiver at length.   As such, the patient has been evaluated and assessed, work-up was performed and treatment was provided in alignment with urgent care protocols and evidence based medicine.  Patient/parent/caregiver has been advised that the patient may require follow up for further testing and treatment if the symptoms continue in spite of treatment, as clinically indicated and appropriate.  If the patient was tested for COVID-19, Influenza and/or RSV, then the patient/parent/guardian was advised to isolate at home pending the results of his/her diagnostic coronavirus test and potentially longer if theyre positive. I have also advised pt that if his/her COVID-19 test returns positive, it's recommended to self-isolate for at least 10 days after symptoms first appeared AND until fever-free for 24 hours without fever reducer AND other symptoms have improved or resolved. Discussed self-isolation recommendations as well as instructions for household member/close contacts as per the CDC and [place] DHHS, and  also gave patient the COVID packet with this information.  Patient/parent/caregiver has been advised to return to the [facility]  or PCP in 3-5 days if no better; to PCP or the Emergency Department if new signs and symptoms develop, or if the current signs or symptoms continue to change or worsen for further workup, evaluation and treatment as clinically indicated and appropriate  The patient will follow up with their current PCP if and as advised. If the patient does not currently have a PCP we will assist them in obtaining one.   The patient may need specialty follow up if the symptoms continue, in spite of conservative treatment and management, for further workup, evaluation, consultation and treatment as clinically indicated and appropriate.   Patient/parent/caregiver verbalized understanding and agreement of plan as discussed.  All questions were addressed during visit.  Please see discharge instructions below for further details of plan.  Discharge Instructions:   Discharge Instructions      Your x-ray was concerning for a large gas burden but there was no evidence of obstruction.  I believe that you have recently had a viral gastroenteritis.  The diarrhea you have been having has cleared your bowels leaving behind a lot of bacteria with nothing to do but produce excess gas.  The gas builds up, creates large bubbles which stretch the bowels and cause pain.  Use you have already discovered, passing gas relieves your pain.  Recommend that you try an over-the-counter medication called Gas-X or Mylanta.  Both of these medications contain a surfactant which is sort of like soapy substance that makes large bubbles of gas breakdown into smaller bubbles of gas rendering them easier to pass.  Low-fat, high-fiber diet and regular exercise such as walking are recommended to help improve bowel function.  Please also be sure that you are drinking plenty of water, between 80 and 100 ounces per day.  Thank you for visiting urgent care today.  Please let us know if there is anything else we can do for you.    This office note has  been dictated using Teaching laboratory technician.  Unfortunately, and despite my best efforts, this method of dictation can sometimes lead to occasional typographical or grammatical errors.  I apologize in advance if this occurs.     Theadora Rama Scales, PA-C 07/12/21 1339

## 2021-07-12 NOTE — ED Triage Notes (Addendum)
Thursday had fevers, abd pains and diarrhea. Reports took imodium for diarrhea. Pt reports that still having abd pain "like a gas bubble" then will go away after passing gas until next one comes along.  ?Adds that pain is worse after eating or drinking  ?

## 2021-07-12 NOTE — Discharge Instructions (Addendum)
Your x-ray was concerning for a large gas burden but there was no evidence of obstruction.  I believe that you have recently had a viral gastroenteritis.  The diarrhea you have been having has cleared your bowels leaving behind a lot of bacteria with nothing to do but produce excess gas.  The gas builds up, creates large bubbles which stretch the bowels and cause pain.  Use you have already discovered, passing gas relieves your pain.  Recommend that you try an over-the-counter medication called Gas-X or Mylanta.  Both of these medications contain a surfactant which is sort of like soapy substance that makes large bubbles of gas breakdown into smaller bubbles of gas rendering them easier to pass. ? ?Low-fat, high-fiber diet and regular exercise such as walking are recommended to help improve bowel function.  Please also be sure that you are drinking plenty of water, between 80 and 100 ounces per day. ? ?Thank you for visiting urgent care today.  Please let us know if there is anything else we can do for you. ?

## 2021-08-06 ENCOUNTER — Ambulatory Visit: Payer: 59 | Admitting: Nurse Practitioner

## 2021-08-26 ENCOUNTER — Encounter: Payer: Self-pay | Admitting: Nurse Practitioner

## 2021-08-26 ENCOUNTER — Ambulatory Visit: Payer: 59 | Admitting: Nurse Practitioner

## 2021-08-26 VITALS — BP 122/80 | HR 83 | Temp 97.6°F | Ht 61.0 in | Wt 235.8 lb

## 2021-08-26 DIAGNOSIS — Z6841 Body Mass Index (BMI) 40.0 and over, adult: Secondary | ICD-10-CM | POA: Diagnosis not present

## 2021-08-26 DIAGNOSIS — R7303 Prediabetes: Secondary | ICD-10-CM | POA: Diagnosis not present

## 2021-08-26 MED ORDER — OZEMPIC (0.25 OR 0.5 MG/DOSE) 2 MG/1.5ML ~~LOC~~ SOPN: 0.5000 mg | PEN_INJECTOR | SUBCUTANEOUS | 2 refills | Status: DC

## 2021-08-26 NOTE — Patient Instructions (Signed)
Stop victoza ?Start ozempic ?F/up in 74month ?

## 2021-08-26 NOTE — Progress Notes (Signed)
? ?             Established Patient Visit ? ?Patient: Leslie Bell   DOB: 06-12-1990   31 y.o. Female  MRN: [id number] ?Visit Date: 08/26/2021 ? ?Subjective:  ?  ?Chief Complaint  ?Patient presents with  ? Follow-up  ?  4 week f/u DM/weight management.   ? ?HPI ?Exercise: . ? ?Class 3 severe obesity due to excess calories with serious comorbidity and body mass index (BMI) of 40.0 to 44.9 in adult [facility]) ?1lb weight loss in last 6weeks with victoza 2.4mg . ?No adverse side effects ?Unable to afford wegovy or saxenda. ?Diet: 1500-1700kcal per day. ?Exercise: personal trainer 2x/week, self guided exercise cardio and strength training. ?Wt Readings from Last 3 Encounters:  ?08/26/21 235 lb 12.8 oz (107 kg)  ?07/08/21 236 lb 9.6 oz (107.3 kg)  ?05/19/21 237 lb 12.8 oz (107.9 kg)  ? ?Agreed to switch to ozempic 0.5mg  weekly ?F/up in 29month ? ?Wt Readings from Last 3 Encounters:  ?08/26/21 235 lb 12.8 oz (107 kg)  ?07/08/21 236 lb 9.6 oz (107.3 kg)  ?05/19/21 237 lb 12.8 oz (107.9 kg)  ?  ?Reviewed medical, surgical, and social history today ? ?Medications: ?Outpatient Medications Prior to Visit  ?Medication Sig  ? Cholecalciferol (VITAMIN D3) 125 MCG (5000 UT) CAPS Take 1 capsule (5,000 Units total) by mouth daily.  ? Insulin Pen Needle (B-D UF III MINI PEN NEEDLES) 31G X 5 MM MISC Use as directed with Victoza daily.  ? Levonorgestrel-Ethinyl Estradiol (AMETHIA) 0.15-0.03 &0.01 MG tablet Take 1 tablet by mouth daily.  ? venlafaxine XR (EFFEXOR-XR) 75 MG 24 hr capsule Take 1 capsule (75 mg total) by mouth daily with breakfast.  ? [DISCONTINUED] liraglutide (VICTOZA) 18 MG/3ML SOPN Inject 2.4 mg into the skin daily.  ? [DISCONTINUED] metFORMIN (GLUCOPHAGE-XR) 750 MG 24 hr tablet Take 750 mg by mouth daily with breakfast. (Patient not taking: Reported on 08/26/2021)  ? [DISCONTINUED] SUMAtriptan (IMITREX) 50 MG tablet Take 1 tablet (50 mg total) by mouth daily for 10 doses. May repeat in 2 hours if headache persists or  recurs.  ? ?Facility-Administered Medications Prior to Visit  ?Medication Dose Route Frequency Provider  ? methylPREDNISolone acetate (DEPO-MEDROL) injection 40 mg  40 mg Intramuscular Once Moksha Dorgan, Bonna Gains, NP  ? ?Reviewed past medical and social history.  ? ?ROS per HPI above ? ? ?   ?Objective:  ?BP 122/80   Pulse 83   Temp 97.6 ?F (36.4 ?C) (Temporal)   Ht 5\' 1"  (1.549 m)   Wt 235 lb 12.8 oz (107 kg)   SpO2 98%   BMI 44.55 kg/m?  ? ?  ? ?Physical Exam ?Cardiovascular:  ?   Rate and Rhythm: Normal rate.  ?   Pulses: Normal pulses.  ?Pulmonary:  ?   Effort: Pulmonary effort is normal.  ?Neurological:  ?   Mental Status: She is alert and oriented to person, place, and time.  ?Psychiatric:     ?   Mood and Affect: Mood normal.     ?   Behavior: Behavior normal.     ?   Thought Content: Thought content normal.  ?  ?No results found for any visits on 08/26/21. ?   ?Assessment & Plan:  ?  ?Problem List Items Addressed This Visit   ? ?  ? Other  ? Class 3 severe obesity due to excess calories with serious comorbidity and body mass index (BMI) of 40.0 to 44.9 in adult [facility]) -  Primary  ?  1lb weight loss in last 6weeks with victoza 2.4mg . ?No adverse side effects ?Unable to afford wegovy or saxenda. ?Diet: 1500-1700kcal per day. ?Exercise: personal trainer 2x/week, self guided exercise cardio and strength training. ?Wt Readings from Last 3 Encounters:  ?08/26/21 235 lb 12.8 oz (107 kg)  ?07/08/21 236 lb 9.6 oz (107.3 kg)  ?05/19/21 237 lb 12.8 oz (107.9 kg)  ? ?Agreed to switch to ozempic 0.5mg  weekly ?F/up in 29month ?  ?  ? Relevant Medications  ? Semaglutide,0.25 or 0.5MG /DOS, (OZEMPIC, 0.25 OR 0.5 MG/DOSE,) 2 MG/1.5ML SOPN  ? Prediabetes  ? Relevant Medications  ? Semaglutide,0.25 or 0.5MG /DOS, (OZEMPIC, 0.25 OR 0.5 MG/DOSE,) 2 MG/1.5ML SOPN  ? ?Return in about 4 weeks (around 09/23/2021) for CPE (fasting) and weight management. ? ?  ? ?Alysia Penna, NP ? ? ?

## 2021-08-26 NOTE — Assessment & Plan Note (Addendum)
1lb weight loss in last 6weeks with victoza 2.4mg . ?No adverse side effects ?Unable to afford wegovy or saxenda. ?Diet: 1500-1700kcal per day. ?Exercise: personal trainer 2x/week, self guided exercise cardio and strength training. ?Wt Readings from Last 3 Encounters:  ?08/26/21 235 lb 12.8 oz (107 kg)  ?07/08/21 236 lb 9.6 oz (107.3 kg)  ?05/19/21 237 lb 12.8 oz (107.9 kg)  ? ?Agreed to switch to ozempic 0.5mg  weekly ?F/up in 57month ?

## 2021-09-27 ENCOUNTER — Encounter: Payer: Self-pay | Admitting: Nurse Practitioner

## 2021-09-27 ENCOUNTER — Ambulatory Visit: Payer: 59 | Admitting: Nurse Practitioner

## 2021-09-27 VITALS — BP 118/72 | HR 92 | Temp 97.3°F | Ht 61.0 in | Wt 232.8 lb

## 2021-09-27 DIAGNOSIS — Z6841 Body Mass Index (BMI) 40.0 and over, adult: Secondary | ICD-10-CM

## 2021-09-27 DIAGNOSIS — R7303 Prediabetes: Secondary | ICD-10-CM | POA: Diagnosis not present

## 2021-09-27 MED ORDER — SEMAGLUTIDE (1 MG/DOSE) 4 MG/3ML ~~LOC~~ SOPN: 1.0000 mg | PEN_INJECTOR | SUBCUTANEOUS | 1 refills | Status: DC

## 2021-09-27 NOTE — Assessment & Plan Note (Addendum)
Admits to inconsistency with diet and exercise. Denies need for nutritionist referral. Denies any adverse effects with ozempic 0.5mg . Wt Readings from Last 3 Encounters:  09/27/21 232 lb 12.8 oz (105.6 kg)  08/26/21 235 lb 12.8 oz (107 kg)  07/08/21 236 lb 9.6 oz (107.3 kg)   Increase ozempic dose to 1mg  weekly Consistency with diet and exercise is very importance to enable adequate weight loss and improve overall health. F/up in 33month

## 2021-09-27 NOTE — Progress Notes (Signed)
Established Patient Visit  Patient: Leslie Bell   DOB: 06-14-1990   31 y.o. Female  MRN: [id number] Visit Date: 09/27/2021  Subjective:    Chief Complaint  Patient presents with   Office Visit    4 week f/u on Weight Management  Sticks to the plan "Somewhat" but says she could make better improvements  No other concerns   HPI Class 3 severe obesity due to excess calories with serious comorbidity and body mass index (BMI) of 40.0 to 44.9 in adult [facility]) Admits to inconsistency with diet and exercise. Denies need for nutritionist referral. Denies any adverse effects with ozempic 0.5mg . Wt Readings from Last 3 Encounters:  09/27/21 232 lb 12.8 oz (105.6 kg)  08/26/21 235 lb 12.8 oz (107 kg)  07/08/21 236 lb 9.6 oz (107.3 kg)   Increase ozempic dose to 1mg  weekly Consistency with diet and exercise is very importance to enable adequate weight loss and improve overall health. F/up in 5month   Reviewed medical, surgical, and social history today  Medications: Outpatient Medications Prior to Visit  Medication Sig   Insulin Pen Needle (B-D UF III MINI PEN NEEDLES) 31G X 5 MM MISC Use as directed with Victoza daily.   Levonorgestrel-Ethinyl Estradiol (AMETHIA) 0.15-0.03 &0.01 MG tablet Take 1 tablet by mouth daily.   venlafaxine XR (EFFEXOR-XR) 75 MG 24 hr capsule Take 1 capsule (75 mg total) by mouth daily with breakfast.   [DISCONTINUED] Semaglutide,0.25 or 0.5MG /DOS, (OZEMPIC, 0.25 OR 0.5 MG/DOSE,) 2 MG/1.5ML SOPN Inject 0.5 mg into the skin once a week.   Cholecalciferol (VITAMIN D3) 125 MCG (5000 UT) CAPS Take 1 capsule (5,000 Units total) by mouth daily. (Patient not taking: Reported on 09/27/2021)   Facility-Administered Medications Prior to Visit  Medication Dose Route Frequency Provider   methylPREDNISolone acetate (DEPO-MEDROL) injection 40 mg  40 mg Intramuscular Once Karysa Heft, 09/29/2021, NP   Reviewed past medical and social history.   ROS per HPI  above      Objective:  BP 118/72 (BP Location: Right Arm, Patient Position: Sitting, Cuff Size: Normal)   Pulse 92   Temp (!) 97.3 F (36.3 C) (Temporal)   Ht 5\' 1"  (1.549 m)   Wt 232 lb 12.8 oz (105.6 kg)   SpO2 98%   BMI 43.99 kg/m      Physical Exam Vitals reviewed.  Constitutional:      Appearance: She is obese.  Cardiovascular:     Rate and Rhythm: Normal rate.     Pulses: Normal pulses.  Pulmonary:     Effort: Pulmonary effort is normal.  Musculoskeletal:     Right lower leg: No edema.     Left lower leg: No edema.  Neurological:     Mental Status: She is alert and oriented to person, place, and time.  Psychiatric:        Mood and Affect: Mood normal.        Behavior: Behavior normal.        Thought Content: Thought content normal.    No results found for any visits on 09/27/21.    Assessment & Plan:    Problem List Items Addressed This Visit       Other   Class 3 severe obesity due to excess calories with serious comorbidity and body mass index (BMI) of 40.0 to 44.9 in adult [facility])    Admits to inconsistency with diet and exercise. Denies need  for nutritionist referral. Denies any adverse effects with ozempic 0.5mg . Wt Readings from Last 3 Encounters:  09/27/21 232 lb 12.8 oz (105.6 kg)  08/26/21 235 lb 12.8 oz (107 kg)  07/08/21 236 lb 9.6 oz (107.3 kg)   Increase ozempic dose to 1mg  weekly Consistency with diet and exercise is very importance to enable adequate weight loss and improve overall health. F/up in 45month      Relevant Medications   Semaglutide, 1 MG/DOSE, 4 MG/3ML SOPN   Prediabetes - Primary   Relevant Medications   Semaglutide, 1 MG/DOSE, 4 MG/3ML SOPN   Return in about 4 weeks (around 10/25/2021) for weight management, prediabetes and , hyperlipidemia (fasting).     10/27/2021, NP

## 2021-09-27 NOTE — Patient Instructions (Signed)
Increase ozempic dose to 1mg  weekly Consistency with diet and exercise is very importance to enable adequate weight loss and improve overall health.

## 2021-10-20 ENCOUNTER — Encounter: Payer: 59 | Admitting: Nurse Practitioner

## 2021-10-26 ENCOUNTER — Ambulatory Visit: Payer: 59 | Admitting: Nurse Practitioner

## 2021-10-26 ENCOUNTER — Encounter: Payer: Self-pay | Admitting: Nurse Practitioner

## 2021-10-26 VITALS — BP 112/82 | HR 85 | Temp 97.0°F | Ht 61.0 in | Wt 225.6 lb

## 2021-10-26 DIAGNOSIS — R7303 Prediabetes: Secondary | ICD-10-CM | POA: Diagnosis not present

## 2021-10-26 DIAGNOSIS — E782 Mixed hyperlipidemia: Secondary | ICD-10-CM | POA: Diagnosis not present

## 2021-10-26 DIAGNOSIS — Z6841 Body Mass Index (BMI) 40.0 and over, adult: Secondary | ICD-10-CM

## 2021-10-26 DIAGNOSIS — N920 Excessive and frequent menstruation with regular cycle: Secondary | ICD-10-CM | POA: Diagnosis not present

## 2021-10-26 LAB — LIPID PANEL
Cholesterol: 169 mg/dL (ref 0–200)
HDL: 35.8 mg/dL — ABNORMAL LOW (ref >39.00)
LDL Cholesterol: 111 mg/dL — ABNORMAL HIGH (ref 0–99)
NonHDL: 133.52
Total CHOL/HDL Ratio: 5
Triglycerides: 112 mg/dL (ref 0.0–149.0)
VLDL: 22.4 mg/dL (ref 0.0–40.0)

## 2021-10-26 LAB — COMPREHENSIVE METABOLIC PANEL
ALT: 37 U/L — ABNORMAL HIGH (ref 0–35)
AST: 17 U/L (ref 0–37)
Albumin: 4 g/dL (ref 3.5–5.2)
Alkaline Phosphatase: 51 U/L (ref 39–117)
BUN: 8 mg/dL (ref 6–23)
CO2: 25 mEq/L (ref 19–32)
Calcium: 8.9 mg/dL (ref 8.4–10.5)
Chloride: 104 mEq/L (ref 96–112)
Creatinine, Ser: 0.87 mg/dL (ref 0.40–1.20)
GFR: 90.6 mL/min (ref >60.00)
Glucose, Bld: 76 mg/dL (ref 70–99)
Potassium: 4.1 mEq/L (ref 3.5–5.1)
Sodium: 137 mEq/L (ref 135–145)
Total Bilirubin: 0.3 mg/dL (ref 0.2–1.2)
Total Protein: 7.3 g/dL (ref 6.0–8.3)

## 2021-10-26 LAB — CBC WITH DIFFERENTIAL/PLATELET
Basophils Absolute: 0 10*3/uL (ref 0.0–0.1)
Basophils Relative: 0.4 % (ref 0.0–3.0)
Eosinophils Absolute: 0 10*3/uL (ref 0.0–0.7)
Eosinophils Relative: 0.7 % (ref 0.0–5.0)
HCT: 38.5 % (ref 36.0–46.0)
Hemoglobin: 12.4 g/dL (ref 12.0–15.0)
Lymphocytes Relative: 29.4 % (ref 12.0–46.0)
Lymphs Abs: 1.8 10*3/uL (ref 0.7–4.0)
MCHC: 32.2 g/dL (ref 30.0–36.0)
MCV: 89.3 fl (ref 78.0–100.0)
Monocytes Absolute: 0.4 10*3/uL (ref 0.1–1.0)
Monocytes Relative: 6.9 % (ref 3.0–12.0)
Neutro Abs: 3.9 10*3/uL (ref 1.4–7.7)
Neutrophils Relative %: 62.6 % (ref 43.0–77.0)
Platelets: 423 10*3/uL — ABNORMAL HIGH (ref 150.0–400.0)
RBC: 4.32 Mil/uL (ref 3.87–5.11)
RDW: 12.8 % (ref 11.5–15.5)
WBC: 6.3 10*3/uL (ref 4.0–10.5)

## 2021-10-26 LAB — HEMOGLOBIN A1C: Hgb A1c MFr Bld: 5 % (ref 4.6–6.5)

## 2021-10-26 MED ORDER — SEMAGLUTIDE (2 MG/DOSE) 8 MG/3ML ~~LOC~~ SOPN: 2.0000 mg | PEN_INJECTOR | SUBCUTANEOUS | 0 refills | Status: DC

## 2021-10-26 NOTE — Patient Instructions (Addendum)
Start prenatal with iron: 1tab daily. Increase ozempic dose to 2mg  weekly. Go to lab

## 2021-10-26 NOTE — Assessment & Plan Note (Addendum)
Lost 7lbs with ozempic 1mg  in last 4weeks Total weight loss of 9lbs in last 27months Denies any adverse effects Exercise : cardio and weight training 4x/week Diet: decrease meal portions, does not count daily caloric intake. Wt Readings from Last 3 Encounters:  10/26/21 225 lb 9.6 oz (102.3 kg)  09/27/21 232 lb 12.8 oz (105.6 kg)  08/26/21 235 lb 12.8 oz (107 kg)   Repeat CMP, hgbA1c and lipid panel: hgbA1c at 5.0 from 5.4% Stable platelet count at 420s x 64yrs. Start iron supplement as discussed. Improved lipid panel: decrease LDL and TC, but low HDL. Need to maintain daily exercise to improve HDL. Normal CMP Increase ozempic dose to 2mg  weekly. F/up in 57month

## 2021-10-26 NOTE — Progress Notes (Signed)
Established Patient Visit  Patient: Leslie Bell   DOB: [birth year]   30 y.o. Female  MRN: [id number] Visit Date: 10/26/2021  Subjective:    Chief Complaint  Patient presents with   Office Visit    F/u weight management, prediabetes, hyperlipidemia Says she's been tolerating the injection well. Tries to exercise 4 x a week, drinks about 90 oz of water a day. Requesting records for Pap   HPI Class 3 severe obesity due to excess calories with serious comorbidity and body mass index (BMI) of 40.0 to 44.9 in adult [facility]) Lost 7lbs with ozempic 1mg  in last 4weeks Total weight loss of 9lbs in last 30months Denies any adverse effects Exercise : cardio and weight training 4x/week Diet: decrease meal portions, does not count daily caloric intake. Wt Readings from Last 3 Encounters:  10/26/21 225 lb 9.6 oz (102.3 kg)  09/27/21 232 lb 12.8 oz (105.6 kg)  08/26/21 235 lb 12.8 oz (107 kg)   Repeat CMP, hgbA1c and lipid panel: hgbA1c at 5.0 from 5.4% Stable platelet count at 420s x 63yrs. Start iron supplement as discussed. Improved lipid panel: decrease LDL and TC, but low HDL. Need to maintain daily exercise to improve HDL. Normal CMP Increase ozempic dose to 2mg  weekly. F/up in 31month   BP Readings from Last 3 Encounters:  10/26/21 112/82  09/27/21 118/72  08/26/21 122/80    Reviewed medical, surgical, and social history today  Medications: Outpatient Medications Prior to Visit  Medication Sig   Cholecalciferol (VITAMIN D3) 125 MCG (5000 UT) CAPS Take 1 capsule (5,000 Units total) by mouth daily.   Insulin Pen Needle (B-D UF III MINI PEN NEEDLES) 31G X 5 MM MISC Use as directed with Victoza daily.   Levonorgestrel-Ethinyl Estradiol (AMETHIA) 0.15-0.03 &0.01 MG tablet Take 1 tablet by mouth daily.   venlafaxine XR (EFFEXOR-XR) 75 MG 24 hr capsule Take 1 capsule (75 mg total) by mouth daily with breakfast.   [DISCONTINUED] Semaglutide, 1 MG/DOSE, 4 MG/3ML SOPN Inject 1  mg as directed once a week.   Facility-Administered Medications Prior to Visit  Medication Dose Route Frequency Provider   methylPREDNISolone acetate (DEPO-MEDROL) injection 40 mg  40 mg Intramuscular Once Lavon Bothwell, 08/28/21, NP   Reviewed past medical and social history.   ROS per HPI above  Last metabolic panel Lab Results  Component Value Date   GLUCOSE 76 10/26/2021   NA 137 10/26/2021   K 4.1 10/26/2021   CL 104 10/26/2021   CO2 25 10/26/2021   BUN 8 10/26/2021   CREATININE 0.87 10/26/2021   GFRNONAA >60 08/09/2020   CALCIUM 8.9 10/26/2021   PROT 7.3 10/26/2021   ALBUMIN 4.0 10/26/2021   BILITOT 0.3 10/26/2021   ALKPHOS 51 10/26/2021   AST 17 10/26/2021   ALT 37 (H) 10/26/2021   ANIONGAP 9 08/09/2020   Last lipids Lab Results  Component Value Date   CHOL 169 10/26/2021   HDL 35.80 (L) 10/26/2021   LDLCALC 111 (H) 10/26/2021   TRIG 112.0 10/26/2021   CHOLHDL 5 10/26/2021   Last hemoglobin A1c Lab Results  Component Value Date   HGBA1C 5.0 10/26/2021        Objective:  BP 112/82 (BP Location: Right Arm, Patient Position: Sitting, Cuff Size: Normal)   Pulse 85   Temp (!) 97 F (36.1 C) (Temporal)   Ht 5\' 1"  (1.549 m)   Wt 225 lb 9.6 oz (  102.3 kg)   SpO2 96%   BMI 42.63 kg/m      Physical Exam Constitutional:      Appearance: She is obese.  Cardiovascular:     Rate and Rhythm: Normal rate.     Pulses: Normal pulses.  Pulmonary:     Effort: Pulmonary effort is normal.  Neurological:     Mental Status: She is alert and oriented to person, place, and time.  Psychiatric:        Mood and Affect: Mood normal.        Behavior: Behavior normal.        Thought Content: Thought content normal.     Results for orders placed or performed in visit on 10/26/21  Hemoglobin A1c  Result Value Ref Range   Hgb A1c MFr Bld 5.0 4.6 - 6.5 %  Comprehensive metabolic panel  Result Value Ref Range   Sodium 137 135 - 145 mEq/L   Potassium 4.1 3.5 - 5.1 mEq/L    Chloride 104 96 - 112 mEq/L   CO2 25 19 - 32 mEq/L   Glucose, Bld 76 70 - 99 mg/dL   BUN 8 6 - 23 mg/dL   Creatinine, Ser 1.61 0.40 - 1.20 mg/dL   Total Bilirubin 0.3 0.2 - 1.2 mg/dL   Alkaline Phosphatase 51 39 - 117 U/L   AST 17 0 - 37 U/L   ALT 37 (H) 0 - 35 U/L   Total Protein 7.3 6.0 - 8.3 g/dL   Albumin 4.0 3.5 - 5.2 g/dL   GFR 09.60 >45.40 mL/min   Calcium 8.9 8.4 - 10.5 mg/dL  Lipid panel  Result Value Ref Range   Cholesterol 169 0 - 200 mg/dL   Triglycerides 981.1 0.0 - 149.0 mg/dL   HDL 91.47 (L) >82.95 mg/dL   VLDL 62.1 0.0 - 30.8 mg/dL   LDL Cholesterol 657 (H) 0 - 99 mg/dL   Total CHOL/HDL Ratio 5    NonHDL 133.52   CBC with Differential/Platelet  Result Value Ref Range   WBC 6.3 4.0 - 10.5 K/uL   RBC 4.32 3.87 - 5.11 Mil/uL   Hemoglobin 12.4 12.0 - 15.0 g/dL   HCT 84.6 96.2 - 95.2 %   MCV 89.3 78.0 - 100.0 fl   MCHC 32.2 30.0 - 36.0 g/dL   RDW 84.1 32.4 - 40.1 %   Platelets 423.0 (H) 150.0 - 400.0 K/uL   Neutrophils Relative % 62.6 43.0 - 77.0 %   Lymphocytes Relative 29.4 12.0 - 46.0 %   Monocytes Relative 6.9 3.0 - 12.0 %   Eosinophils Relative 0.7 0.0 - 5.0 %   Basophils Relative 0.4 0.0 - 3.0 %   Neutro Abs 3.9 1.4 - 7.7 K/uL   Lymphs Abs 1.8 0.7 - 4.0 K/uL   Monocytes Absolute 0.4 0.1 - 1.0 K/uL   Eosinophils Absolute 0.0 0.0 - 0.7 K/uL   Basophils Absolute 0.0 0.0 - 0.1 K/uL  HM PAP SMEAR  Result Value Ref Range   HM Pap smear WNL       Assessment & Plan:    Problem List Items Addressed This Visit       Other   Class 3 severe obesity due to excess calories with serious comorbidity and body mass index (BMI) of 40.0 to 44.9 in adult [facility]) - Primary    Lost 7lbs with ozempic 1mg  in last 4weeks Total weight loss of 9lbs in last 72months Denies any adverse effects Exercise : cardio and weight training  4x/week Diet: decrease meal portions, does not count daily caloric intake. Wt Readings from Last 3 Encounters:  10/26/21 225 lb 9.6 oz (102.3  kg)  09/27/21 232 lb 12.8 oz (105.6 kg)  08/26/21 235 lb 12.8 oz (107 kg)   Repeat CMP, hgbA1c and lipid panel: hgbA1c at 5.0 from 5.4% Stable platelet count at 420s x 1yrs. Start iron supplement as discussed. Improved lipid panel: decrease LDL and TC, but low HDL. Need to maintain daily exercise to improve HDL. Normal CMP Increase ozempic dose to 2mg  weekly. F/up in 40month      Relevant Medications   Semaglutide, 2 MG/DOSE, 8 MG/3ML SOPN   Other Relevant Orders   Comprehensive metabolic panel (Completed)   Mixed hyperlipidemia   Relevant Orders   Lipid panel (Completed)   Prediabetes   Relevant Medications   Semaglutide, 2 MG/DOSE, 8 MG/3ML SOPN   Other Relevant Orders   Hemoglobin A1c (Completed)   Other Visit Diagnoses     Menorrhagia with regular cycle       Relevant Orders   CBC with Differential/Platelet (Completed)      Return in about 3 months (around 01/26/2022) for prediabetes, weight management and hyperlipidemia.     01/28/2022, NP

## 2021-11-21 ENCOUNTER — Other Ambulatory Visit: Payer: Self-pay | Admitting: Nurse Practitioner

## 2021-11-21 DIAGNOSIS — R7303 Prediabetes: Secondary | ICD-10-CM

## 2021-11-22 NOTE — Telephone Encounter (Signed)
Chart supports Rx Last OV: 10/2021 Next OV: 01/2022  

## 2021-11-29 ENCOUNTER — Encounter: Payer: Self-pay | Admitting: Nurse Practitioner

## 2021-12-01 ENCOUNTER — Telehealth: Payer: Self-pay | Admitting: Nurse Practitioner

## 2021-12-01 NOTE — Telephone Encounter (Signed)
Caller Name: Quida Glasser Call back phone #: [phone]  MEDICATION(S): Ozempic 2mg   Days of Med Remaining: 0, out for two weeks  Has the patient contacted their pharmacy (YES/NO)? yes What did pharmacy advise? All pharmacies in the area are currently on backorder. Pt is worried about insulin levels, asked if it were possible to write her a temporary prescription for 1mg  as she did confirm with the pharmacy that 1mg  is in stock.   Preferred [facility]acy: ([facility]) [address] [place], [address]  ~~~Please advise patient/caregiver to allow 2-3 business days to process RX refills.

## 2021-12-02 NOTE — Telephone Encounter (Signed)
Pt advised. Says she already picked up a 1mg  dose from her pharmacy

## 2022-01-03 ENCOUNTER — Encounter: Payer: Self-pay | Admitting: Nurse Practitioner

## 2022-01-03 ENCOUNTER — Ambulatory Visit: Payer: 59 | Admitting: Nurse Practitioner

## 2022-01-03 DIAGNOSIS — Z6841 Body Mass Index (BMI) 40.0 and over, adult: Secondary | ICD-10-CM | POA: Diagnosis not present

## 2022-01-03 DIAGNOSIS — R7303 Prediabetes: Secondary | ICD-10-CM

## 2022-01-03 MED ORDER — SEMAGLUTIDE (1 MG/DOSE) 4 MG/3ML ~~LOC~~ SOPN: 1.0000 mg | PEN_INJECTOR | SUBCUTANEOUS | 1 refills | Status: DC

## 2022-01-03 NOTE — Assessment & Plan Note (Signed)
Due to drug shortage, she has not administered ozempic in last 4weeks. She has noticed increased appetite and cravings. Therefore her diet has not been consistent. Her insurance does not cover wegovy rx She did not schedule appt with nutritionist Exercise: works with a Systems analyst 3x/week Gained 5lbs in last 35months Last hgbA1c at 5.0% Wt Readings from Last 3 Encounters:  01/03/22 230 lb 6.4 oz (104.5 kg)  10/26/21 225 lb 9.6 oz (102.3 kg)  09/27/21 232 lb 12.8 oz (105.6 kg)   We discussed use of qsymia vs contrave vs metformin. She declined to used any of theses medication. She states metformin was ineffective and phentermine caused increase anxiety. Advised to schedule appt with nutritionist Schedule f/up appt when able to resume ozempic.

## 2022-01-03 NOTE — Progress Notes (Signed)
Established Patient Visit  Patient: Leslie Bell   DOB: [birth year]   31 y.o. Female  MRN: [id number] Visit Date: 01/03/2022  Subjective:    Chief Complaint  Patient presents with   Follow-up    Wants to discuss alternate option for ozempic.   HPI Class 3 severe obesity due to excess calories with serious comorbidity and body mass index (BMI) of 40.0 to 44.9 in adult [facility]) Due to drug shortage, she has not administered ozempic in last 4weeks. She has noticed increased appetite and cravings. Therefore her diet has not been consistent. Her insurance does not cover wegovy rx She did not schedule appt with nutritionist Exercise: works with a Systems analyst 3x/week Gained 5lbs in last 29months Last hgbA1c at 5.0% Wt Readings from Last 3 Encounters:  01/03/22 230 lb 6.4 oz (104.5 kg)  10/26/21 225 lb 9.6 oz (102.3 kg)  09/27/21 232 lb 12.8 oz (105.6 kg)   We discussed use of qsymia vs contrave vs metformin. She declined to used any of theses medication. She states metformin was ineffective and phentermine caused increase anxiety. Advised to schedule appt with nutritionist Schedule f/up appt when able to resume ozempic.  Reviewed medical, surgical, and social history today  Medications: Outpatient Medications Prior to Visit  Medication Sig   Cholecalciferol (VITAMIN D3) 125 MCG (5000 UT) CAPS Take 1 capsule (5,000 Units total) by mouth daily.   norgestimate-ethinyl estradiol (ORTHO-CYCLEN) 0.25-35 MG-MCG tablet Take 1 tablet by mouth daily.   Semaglutide, 2 MG/DOSE, (OZEMPIC, 2 MG/DOSE,) 8 MG/3ML SOPN INJECT 2 MG AS DIRECTED ONCE A WEEK.   venlafaxine XR (EFFEXOR-XR) 75 MG 24 hr capsule Take 1 capsule (75 mg total) by mouth daily with breakfast.   [DISCONTINUED] Insulin Pen Needle (B-D UF III MINI PEN NEEDLES) 31G X 5 MM MISC Use as directed with Victoza daily. (Patient not taking: Reported on 01/03/2022)   [DISCONTINUED] Levonorgestrel-Ethinyl Estradiol (AMETHIA)  0.15-0.03 &0.01 MG tablet Take 1 tablet by mouth daily. (Patient not taking: Reported on 01/03/2022)   [DISCONTINUED] methylPREDNISolone acetate (DEPO-MEDROL) injection 40 mg    No facility-administered medications prior to visit.   Reviewed past medical and social history.   ROS per HPI above      Objective:  BP 110/70 (BP Location: Left Arm, Patient Position: Sitting, Cuff Size: Large)   Pulse 97   Temp (!) 97 F (36.1 C)   Ht 5' 1.5" (1.562 m)   Wt 230 lb 6.4 oz (104.5 kg)   SpO2 98%   BMI 42.83 kg/m      Physical Exam Constitutional:      Appearance: She is obese.  Cardiovascular:     Rate and Rhythm: Normal rate.     Pulses: Normal pulses.  Pulmonary:     Effort: Pulmonary effort is normal.  Neurological:     Mental Status: She is alert and oriented to person, place, and time.  Psychiatric:        Mood and Affect: Mood normal.        Behavior: Behavior normal.        Thought Content: Thought content normal.     No results found for any visits on 01/03/22.    Assessment & Plan:    Problem List Items Addressed This Visit       Other   Class 3 severe obesity due to excess calories with serious comorbidity and body mass index (BMI) of 40.0  to 44.9 in adult [facility]) - Primary    Due to drug shortage, she has not administered ozempic in last 4weeks. She has noticed increased appetite and cravings. Therefore her diet has not been consistent. Her insurance does not cover wegovy rx She did not schedule appt with nutritionist Exercise: works with a Systems analyst 3x/week Gained 5lbs in last 29months Last hgbA1c at 5.0% Wt Readings from Last 3 Encounters:  01/03/22 230 lb 6.4 oz (104.5 kg)  10/26/21 225 lb 9.6 oz (102.3 kg)  09/27/21 232 lb 12.8 oz (105.6 kg)   We discussed use of qsymia vs contrave vs metformin. She declined to used any of theses medication. She states metformin was ineffective and phentermine caused increase anxiety. Advised to schedule appt with  nutritionist Schedule f/up appt when able to resume ozempic.      Return if symptoms worsen or fail to improve.     Alysia Penna, NP

## 2022-01-03 NOTE — Patient Instructions (Addendum)
Let me know if 1mg  dose of ozempic is available Schedule appt with nutritionist. Schedule 48month f/up appt when you resume ozempic injection

## 2022-01-26 ENCOUNTER — Ambulatory Visit: Payer: 59 | Admitting: Nurse Practitioner

## 2022-02-24 ENCOUNTER — Encounter: Payer: Self-pay | Admitting: Nurse Practitioner

## 2022-02-25 ENCOUNTER — Telehealth (INDEPENDENT_AMBULATORY_CARE_PROVIDER_SITE_OTHER): Payer: 59 | Admitting: Nurse Practitioner

## 2022-02-25 ENCOUNTER — Encounter: Payer: Self-pay | Admitting: Nurse Practitioner

## 2022-02-25 VITALS — Ht 61.5 in | Wt 230.0 lb

## 2022-02-25 DIAGNOSIS — F411 Generalized anxiety disorder: Secondary | ICD-10-CM | POA: Diagnosis not present

## 2022-02-25 DIAGNOSIS — F321 Major depressive disorder, single episode, moderate: Secondary | ICD-10-CM | POA: Diagnosis not present

## 2022-02-25 DIAGNOSIS — B360 Pityriasis versicolor: Secondary | ICD-10-CM

## 2022-02-25 MED ORDER — TRAZODONE HCL 50 MG PO TABS: 25.0000 mg | ORAL_TABLET | Freq: Every evening | ORAL | 5 refills | Status: DC | PRN

## 2022-02-25 MED ORDER — KETOCONAZOLE 2 % EX SHAM: 1.0000 | MEDICATED_SHAMPOO | CUTANEOUS | 3 refills | Status: DC

## 2022-02-25 NOTE — Progress Notes (Signed)
Virtual Visit via Video Note  I connected withNAME@ on 02/25/22 at 10:40 AM EDT by a video enabled telemedicine application and verified that I am speaking with the correct person using two identifiers.  Location: Patient:Home Provider: Office Participants: patient and provider  I discussed the limitations of evaluation and management by telemedicine and the availability of in person appointments. I also discussed with the patient that there may be a patient responsible charge related to this service. The patient expressed understanding and agreed to proceed.  NW:[id number] and rash  History of Present Illness:  GAD (generalized anxiety disorder) Increased anxiety and sleep disturbance due to conflict with close friend. Some improvement with exercise and relaxation techniques but temporal. Has difficulty staying asleep and has racing thoughts. Current use of effexor daily She had discontinued sessions with her therapist. No SI/Hi or hallucination No ETOH or caffeine or illicit drug use.  Sent trazodone 25-50mg  daily Advised to resume ssions with the therapist F/up in 72month  Tinea versicolor Chronic, recurrent, on upper and lower back, associated with itching and dryness. Resolved in past with diflucan tabs. No improvement with OTC antifungal cream.  Sent ketoconazole shampoo.  Wt Readings from Last 3 Encounters:  02/25/22 230 lb (104.3 kg)  01/03/22 230 lb 6.4 oz (104.5 kg)  10/26/21 225 lb 9.6 oz (102.3 kg)       02/25/2022   10:25 AM 01/03/2022    8:29 AM 08/26/2021    9:27 AM  Depression screen PHQ 2/9  Decreased Interest 2 0 0  Down, Depressed, Hopeless 1 0 0  PHQ - 2 Score 3 0 0  Altered sleeping 3  0  Tired, decreased energy 3 2 2   Change in appetite 1 0 0  Feeling bad or failure about yourself  2 0 0  Trouble concentrating 3 0 0  Moving slowly or fidgety/restless 1 0 0  Suicidal thoughts 0 0 0  PHQ-9 Score 16  2  Difficult doing work/chores Extremely dIfficult  Somewhat difficult Not difficult at all       02/25/2022   10:26 AM 05/19/2021    8:33 AM 12/21/2020    8:01 AM 12/12/2019    9:03 AM  GAD 7 : Generalized Anxiety Score  Nervous, Anxious, on Edge 3 0 0 0  Control/stop worrying 2 0 0 0  Worry too much - different things 3 1 0 0  Trouble relaxing 3 0 0 0  Restless 2 0 0 0  Easily annoyed or irritable 1 0 1 0  Afraid - awful might happen 0 0 0 0  Total GAD 7 Score 14 1 1  0  Anxiety Difficulty Extremely difficult Somewhat difficult Not difficult at all    Observations/Objective: Alert and oriented x 4 Normal mood and attention.  Assessment and Plan: Leslie Bell was seen today for uri.  Diagnoses and all orders for this visit:  GAD (generalized anxiety disorder) -     traZODone (DESYREL) 50 MG tablet; Take 0.5-1 tablets (25-50 mg total) by mouth at bedtime as needed for sleep.  Depression, major, single episode, moderate (HCC) -     traZODone (DESYREL) 50 MG tablet; Take 0.5-1 tablets (25-50 mg total) by mouth at bedtime as needed for sleep.  Tinea versicolor -     ketoconazole (NIZORAL) 2 % shampoo; Apply 1 Application topically 2 (two) times a week.    Follow Up Instructions: See instructions above   I discussed the assessment and treatment plan with the patient. The patient was provided  an opportunity to ask questions and all were answered. The patient agreed with the plan and demonstrated an understanding of the instructions.   The patient was advised to call back or seek an in-person evaluation if the symptoms worsen or if the condition fails to improve as anticipated.  Alysia Penna, NP

## 2022-02-25 NOTE — Assessment & Plan Note (Signed)
Increased anxiety and sleep disturbance due to conflict with close friend. Some improvement with exercise and relaxation techniques but temporal. Has difficulty staying asleep and has racing thoughts. Current use of effexor daily She had discontinued sessions with her therapist. No SI/Hi or hallucination No ETOH or caffeine or illicit drug use.  Sent trazodone 25-50mg  daily Advised to resume ssions with the therapist F/up in 24month

## 2022-02-25 NOTE — Assessment & Plan Note (Signed)
Chronic, recurrent, on upper and lower back, associated with itching and dryness. Resolved in past with diflucan tabs. No improvement with OTC antifungal cream.  Sent ketoconazole shampoo.

## 2022-03-19 ENCOUNTER — Other Ambulatory Visit: Payer: Self-pay | Admitting: Nurse Practitioner

## 2022-03-19 DIAGNOSIS — F321 Major depressive disorder, single episode, moderate: Secondary | ICD-10-CM

## 2022-03-19 DIAGNOSIS — F411 Generalized anxiety disorder: Secondary | ICD-10-CM

## 2022-03-21 NOTE — Telephone Encounter (Signed)
Chart supports Rx Last OV: VV 02/2022 Next OV: 03/2022

## 2022-03-23 ENCOUNTER — Telehealth: Payer: Self-pay | Admitting: Nurse Practitioner

## 2022-03-23 ENCOUNTER — Ambulatory Visit: Payer: 59 | Admitting: Nurse Practitioner

## 2022-03-23 NOTE — Telephone Encounter (Signed)
Pt was a no show 11/15 for an OV with [facility]. This Is her first since April/2021. Letter sent

## 2022-03-28 ENCOUNTER — Encounter: Payer: Self-pay | Admitting: Nurse Practitioner

## 2022-03-28 ENCOUNTER — Ambulatory Visit: Payer: 59 | Admitting: Nurse Practitioner

## 2022-03-28 VITALS — BP 128/87 | HR 88 | Temp 97.2°F | Wt 235.6 lb

## 2022-03-28 DIAGNOSIS — F321 Major depressive disorder, single episode, moderate: Secondary | ICD-10-CM

## 2022-03-28 DIAGNOSIS — Z6841 Body Mass Index (BMI) 40.0 and over, adult: Secondary | ICD-10-CM

## 2022-03-28 DIAGNOSIS — F411 Generalized anxiety disorder: Secondary | ICD-10-CM | POA: Diagnosis not present

## 2022-03-28 MED ORDER — VENLAFAXINE HCL ER 150 MG PO CP24: 150.0000 mg | ORAL_CAPSULE | Freq: Every day | ORAL | 1 refills | Status: DC

## 2022-03-28 NOTE — Patient Instructions (Signed)
Increase effexor dose to 150mg  Maintain appt with psychologist Maintain daily exercise Schedule appt with nutritionist. F/up in 26month

## 2022-03-28 NOTE — Progress Notes (Signed)
Established Patient Visit  Patient: Leslie Bell   DOB: 1991/01/15   30 y.o. Female  MRN: [id number] Visit Date: 03/28/2022  Subjective:    Chief Complaint  Patient presents with  . Follow-up    Anxiety and weight management, pt states the anxiety is getting better but would like to explore daily medication to help manage. Pt states she is unable to get ozempic 2 mg is on a national back order    HPI Class 3 severe obesity due to excess calories with serious comorbidity and body mass index (BMI) of 40.0 to 44.9 in adult [facility]) Has not made any diet modifications. Still to schedule appt with nutritionist. She resumed ozempic 1mg  1week ago due to drug shortage. Wt Readings from Last 3 Encounters:  03/28/22 235 lb 9.6 oz (106.9 kg)  02/25/22 230 lb (104.3 kg)  01/03/22 230 lb 6.4 oz (104.5 kg)    Maintain med dose Schedule appt with nutritionist. Maintain 165mins pf moderate intensity exercise weekly. F/up in 20month  Depressive disorder Minimal improvement with effexor 75mg  XR Improved sleep with trazodone 25-50mg  Denies any adverse side effects.  Increase effexor to 150mg  XR Maintain appt with therapist Maintain trazodone dose F/up in 1month  Reviewed medical, surgical, and social history today  Medications: Outpatient Medications Prior to Visit  Medication Sig  . Cholecalciferol (VITAMIN D3) 125 MCG (5000 UT) CAPS Take 1 capsule (5,000 Units total) by mouth daily.  Marland Kitchen ketoconazole (NIZORAL) 2 % shampoo Apply 1 Application topically 2 (two) times a week.  . norgestimate-ethinyl estradiol (ORTHO-CYCLEN) 0.25-35 MG-MCG tablet Take 1 tablet by mouth daily.  . Semaglutide, 1 MG/DOSE, 4 MG/3ML SOPN Inject 1 mg as directed once a week.  . traZODone (DESYREL) 50 MG tablet Take 0.5-1 tablets (25-50 mg total) by mouth at bedtime as needed for sleep.  . [DISCONTINUED] venlafaxine XR (EFFEXOR-XR) 75 MG 24 hr capsule Take 1 capsule (75 mg total) by mouth daily with  breakfast.   No facility-administered medications prior to visit.   Reviewed past medical and social history.   ROS per HPI above      Objective:  BP 128/87 (BP Location: Left Arm, Patient Position: Sitting, Cuff Size: Normal)   Pulse 88   Temp (!) 97.2 F (36.2 C) (Temporal)   Wt 235 lb 9.6 oz (106.9 kg)   SpO2 99%   BMI 43.80 kg/m      Physical Exam Vitals reviewed.  Neurological:     Mental Status: She is alert and oriented to person, place, and time.  Psychiatric:        Attention and Perception: Attention normal.        Mood and Affect: Affect is flat.        Speech: Speech normal.        Behavior: Behavior is cooperative.        Thought Content: Thought content normal.        Cognition and Memory: Cognition normal.        Judgment: Judgment is not impulsive.    No results found for any visits on 03/28/22.    Assessment & Plan:    Problem List Items Addressed This Visit       Other   Class 3 severe obesity due to excess calories with serious comorbidity and body mass index (BMI) of 40.0 to 44.9 in adult [facility]) - Primary    Has not made any diet  modifications. Still to schedule appt with nutritionist. She resumed ozempic 1mg  1week ago due to drug shortage. Wt Readings from Last 3 Encounters:  03/28/22 235 lb 9.6 oz (106.9 kg)  02/25/22 230 lb (104.3 kg)  01/03/22 230 lb 6.4 oz (104.5 kg)    Maintain med dose Schedule appt with nutritionist. Maintain 01/05/22 pf moderate intensity exercise weekly. F/up in 53month      Depressive disorder    Minimal improvement with effexor 75mg  XR Improved sleep with trazodone 25-50mg  Denies any adverse side effects.  Increase effexor to 150mg  XR Maintain appt with therapist Maintain trazodone dose F/up in 20month      Relevant Medications   venlafaxine XR (EFFEXOR-XR) 150 MG 24 hr capsule   GAD (generalized anxiety disorder)   Relevant Medications   venlafaxine XR (EFFEXOR-XR) 150 MG 24 hr capsule   Return in  about 4 weeks (around 04/25/2022) for depression and anxiety, Weight management.     , NP

## 2022-03-28 NOTE — Assessment & Plan Note (Signed)
Minimal improvement with effexor 75mg  XR Improved sleep with trazodone 25-50mg  Denies any adverse side effects.  Increase effexor to 150mg  XR Maintain appt with therapist Maintain trazodone dose F/up in 76month

## 2022-03-28 NOTE — Assessment & Plan Note (Addendum)
Has not made any diet modifications. Still to schedule appt with nutritionist. She resumed ozempic 1mg  1week ago due to drug shortage. Wt Readings from Last 3 Encounters:  03/28/22 235 lb 9.6 oz (106.9 kg)  02/25/22 230 lb (104.3 kg)  01/03/22 230 lb 6.4 oz (104.5 kg)    Maintain med dose Schedule appt with nutritionist. Maintain 01/05/22 pf moderate intensity exercise weekly. F/up in 10month

## 2022-04-05 NOTE — Telephone Encounter (Signed)
1st no show, fee waived, letter sent, pt was seen 11/20

## 2022-04-26 ENCOUNTER — Ambulatory Visit: Payer: 59 | Admitting: Nurse Practitioner

## 2022-05-10 DIAGNOSIS — F4320 Adjustment disorder, unspecified: Secondary | ICD-10-CM | POA: Diagnosis not present

## 2022-05-24 ENCOUNTER — Ambulatory Visit
Admission: EM | Admit: 2022-05-24 | Discharge: 2022-05-24 | Disposition: A | Discharge status: Home / Self Care | Payer: BC Managed Care – PPO | Attending: Urgent Care | Admitting: Urgent Care

## 2022-05-24 ENCOUNTER — Ambulatory Visit (INDEPENDENT_AMBULATORY_CARE_PROVIDER_SITE_OTHER): Payer: BC Managed Care – PPO

## 2022-05-24 DIAGNOSIS — M542 Cervicalgia: Secondary | ICD-10-CM

## 2022-05-24 DIAGNOSIS — M419 Scoliosis, unspecified: Secondary | ICD-10-CM

## 2022-05-24 DIAGNOSIS — M549 Dorsalgia, unspecified: Secondary | ICD-10-CM

## 2022-05-24 DIAGNOSIS — G44209 Tension-type headache, unspecified, not intractable: Secondary | ICD-10-CM

## 2022-05-24 DIAGNOSIS — F4320 Adjustment disorder, unspecified: Secondary | ICD-10-CM | POA: Diagnosis not present

## 2022-05-24 DIAGNOSIS — M5412 Radiculopathy, cervical region: Secondary | ICD-10-CM

## 2022-05-24 MED ORDER — TIZANIDINE HCL 4 MG PO TABS: 4.0000 mg | ORAL_TABLET | Freq: Every day | ORAL | 0 refills | Status: DC

## 2022-05-24 MED ORDER — NAPROXEN 500 MG PO TABS: 500.0000 mg | ORAL_TABLET | Freq: Two times a day (BID) | ORAL | 0 refills | Status: DC

## 2022-05-24 MED ORDER — FAMOTIDINE 20 MG PO TABS: 20.0000 mg | ORAL_TABLET | Freq: Two times a day (BID) | ORAL | 0 refills | Status: DC

## 2022-05-24 MED ORDER — PREDNISONE 20 MG PO TABS: ORAL_TABLET | ORAL | 0 refills | Status: DC

## 2022-05-24 NOTE — ED Provider Notes (Signed)
[facility]  Note:  This document was prepared using Systems analyst and may include unintentional dictation errors.  MRN: [id number] DOB: Oct 01, 1990  Subjective:   Leslie Bell is a 32 y.o. female presenting for 3-4 week history of acute onset persistent neck pain. Symptoms are worsening, radiate to the trapezius and shoulders on either side and down her back. Is starting to have headaches on the back of her head that radiate from her neck. No fall, trauma, numbness or tingling, saddle paresthesia, changes to bowel or urinary habits. Has a history of mild thoracic scoliosis.   No current facility-administered medications for this encounter.  Current Outpatient Medications:    Cholecalciferol (VITAMIN D3) 125 MCG (5000 UT) CAPS, Take 1 capsule (5,000 Units total) by mouth daily., Disp: 90 capsule, Rfl: 0   ketoconazole (NIZORAL) 2 % shampoo, Apply 1 Application topically 2 (two) times a week., Disp: 120 mL, Rfl: 3   norgestimate-ethinyl estradiol (ORTHO-CYCLEN) 0.25-35 MG-MCG tablet, Take 1 tablet by mouth daily., Disp: , Rfl:    Semaglutide, 1 MG/DOSE, 4 MG/3ML SOPN, Inject 1 mg as directed once a week., Disp: 3 mL, Rfl: 1   traZODone (DESYREL) 50 MG tablet, Take 0.5-1 tablets (25-50 mg total) by mouth at bedtime as needed for sleep., Disp: 30 tablet, Rfl: 5   venlafaxine XR (EFFEXOR-XR) 150 MG 24 hr capsule, Take 1 capsule (150 mg total) by mouth daily with breakfast., Disp: 90 capsule, Rfl: 1   No Known Allergies  Past Medical History:  Diagnosis Date   Depression    PCOS (polycystic ovarian syndrome)      History reviewed. No pertinent surgical history.  Family History  Problem Relation Age of Onset   Hypertension Mother    Hypertension Father    Alcohol abuse Father    Diabetes Maternal Grandmother    Kidney disease Maternal Grandmother    Stroke Maternal Grandmother    Stroke Maternal Grandfather    Drug abuse Paternal  Grandmother    Heart disease Paternal Grandmother    Cancer Paternal Grandfather 70       unknown    Social History   Tobacco Use   Smoking status: Never   Smokeless tobacco: Never  Vaping Use   Vaping Use: Never used  Substance Use Topics   Alcohol use: Yes    Alcohol/week: 1.0 standard drink of alcohol    Types: 1 Glasses of wine per week    Comment: once a week   Drug use: Never    ROS   Objective:   Vitals: BP 124/82 (BP Location: Right Arm)   Pulse 89   Temp 98 F (36.7 C) (Oral)   Resp 18   SpO2 96%   Physical Exam Constitutional:      General: She is not in acute distress.    Appearance: Normal appearance. She is well-developed. She is not ill-appearing, toxic-appearing or diaphoretic.  HENT:     Head: Normocephalic and atraumatic.     Right Ear: External ear normal.     Left Ear: External ear normal.     Nose: Nose normal.     Mouth/Throat:     Mouth: Mucous membranes are moist.  Eyes:     General: No scleral icterus.       Right eye: No discharge.        Left eye: No discharge.     Extraocular Movements: Extraocular movements intact.  Cardiovascular:     Rate and Rhythm:  Normal rate.  Pulmonary:     Effort: Pulmonary effort is normal.  Musculoskeletal:     Cervical back: Neck supple. Spasms and tenderness (positive Spurling maneuver to either side) present. No swelling, edema, deformity, erythema, signs of trauma, lacerations, rigidity, torticollis, bony tenderness or crepitus. Pain with movement present. No spinous process tenderness or muscular tenderness. Decreased range of motion.     Thoracic back: Spasms and tenderness present. No swelling, edema, deformity, signs of trauma, lacerations or bony tenderness. Normal range of motion. No scoliosis.  Skin:    General: Skin is warm and dry.  Neurological:     General: No focal deficit present.     Mental Status: She is alert and oriented to person, place, and time.     Cranial Nerves: No cranial  nerve deficit.     Motor: No weakness.     Coordination: Coordination normal.     Gait: Gait normal.     Deep Tendon Reflexes: Reflexes normal.  Psychiatric:        Mood and Affect: Mood normal.        Behavior: Behavior normal.        Thought Content: Thought content normal.        Judgment: Judgment normal.     Assessment and Plan :   PDMP not reviewed this encounter.  1. Cervical radiculopathy   2. Upper back pain   3. Neck pain   4. Tension headache   5. Scoliosis of thoracic spine, unspecified scoliosis type     X-ray over-read was pending at time of discharge, recommended follow up with only abnormal results. Otherwise will not call for negative over-read. Patient was in agreement.  Patient has signs and symptoms consistent with cervical radiculopathy and as such will use an oral prednisone course for the next 10 days.  Recommend using famotidine while on the prednisone.  Thereafter she can switch to naproxen and maintain famotidine.  She can start tizanidine.  Follow-up with [facility].  No signs of an acute spinal emergency. Counseled patient on potential for adverse effects with medications prescribed/recommended today, ER and return-to-clinic precautions discussed, patient verbalized understanding.    Jaynee Eagles, PA-C 05/24/22 1245

## 2022-05-24 NOTE — ED Triage Notes (Signed)
Pt c/o neck pain that began about a week before 05/02/22. She states the pain is now causing shoulder/ back pain, and headache.  Home interventions: ice pack, heating patches

## 2022-06-07 DIAGNOSIS — F4320 Adjustment disorder, unspecified: Secondary | ICD-10-CM | POA: Diagnosis not present

## 2022-06-13 ENCOUNTER — Encounter: Payer: Self-pay | Admitting: Nurse Practitioner

## 2022-06-13 ENCOUNTER — Ambulatory Visit: Payer: BC Managed Care – PPO | Admitting: Nurse Practitioner

## 2022-06-13 VITALS — BP 122/82 | HR 74 | Temp 97.8°F | Wt 237.0 lb

## 2022-06-13 DIAGNOSIS — E782 Mixed hyperlipidemia: Secondary | ICD-10-CM | POA: Diagnosis not present

## 2022-06-13 DIAGNOSIS — E559 Vitamin D deficiency, unspecified: Secondary | ICD-10-CM | POA: Diagnosis not present

## 2022-06-13 DIAGNOSIS — Z6841 Body Mass Index (BMI) 40.0 and over, adult: Secondary | ICD-10-CM | POA: Diagnosis not present

## 2022-06-13 DIAGNOSIS — R7303 Prediabetes: Secondary | ICD-10-CM | POA: Diagnosis not present

## 2022-06-13 DIAGNOSIS — F3341 Major depressive disorder, recurrent, in partial remission: Secondary | ICD-10-CM | POA: Diagnosis not present

## 2022-06-13 DIAGNOSIS — M62838 Other muscle spasm: Secondary | ICD-10-CM

## 2022-06-13 LAB — COMPREHENSIVE METABOLIC PANEL
ALT: 23 U/L (ref 0–35)
AST: 15 U/L (ref 0–37)
Albumin: 4.5 g/dL (ref 3.5–5.2)
Alkaline Phosphatase: 61 U/L (ref 39–117)
BUN: 8 mg/dL (ref 6–23)
CO2: 28 mEq/L (ref 19–32)
Calcium: 9.6 mg/dL (ref 8.4–10.5)
Chloride: 103 mEq/L (ref 96–112)
Creatinine, Ser: 0.9 mg/dL (ref 0.40–1.20)
GFR: 86.6 mL/min (ref >60.00)
Glucose, Bld: 83 mg/dL (ref 70–99)
Potassium: 4.5 mEq/L (ref 3.5–5.1)
Sodium: 139 mEq/L (ref 135–145)
Total Bilirubin: 0.4 mg/dL (ref 0.2–1.2)
Total Protein: 7.4 g/dL (ref 6.0–8.3)

## 2022-06-13 LAB — LIPID PANEL
Cholesterol: 199 mg/dL (ref 0–200)
HDL: 38.8 mg/dL — ABNORMAL LOW (ref >39.00)
LDL Cholesterol: 144 mg/dL — ABNORMAL HIGH (ref 0–99)
NonHDL: 159.98
Total CHOL/HDL Ratio: 5
Triglycerides: 79 mg/dL (ref 0.0–149.0)
VLDL: 15.8 mg/dL (ref 0.0–40.0)

## 2022-06-13 LAB — HEMOGLOBIN A1C: Hgb A1c MFr Bld: 5.5 % (ref 4.6–6.5)

## 2022-06-13 LAB — VITAMIN D 25 HYDROXY (VIT D DEFICIENCY, FRACTURES): VITD: 26.32 ng/mL — ABNORMAL LOW (ref 30.00–100.00)

## 2022-06-13 MED ORDER — WEGOVY 2.4 MG/0.75ML ~~LOC~~ SOAJ: 2.4000 mg | SUBCUTANEOUS | 1 refills | Status: DC

## 2022-06-13 NOTE — Assessment & Plan Note (Addendum)
Improved and stable Meets with therapist weekly: Leslie Bell. Daily use of effexor without any adverse effects

## 2022-06-13 NOTE — Patient Instructions (Signed)
Go to lab Increase wegovy to 2.4mg  weekly Maintain daily exercise and heart healthy diet You will be contacted to schedule appt with PT Start home exercises Continue naproxen and zanaflex as needed for pain  Shoulder Exercises Ask your health care provider which exercises are safe for you. Do exercises exactly as told by your health care provider and adjust them as directed. It is normal to feel mild stretching, pulling, tightness, or discomfort as you do these exercises. Stop right away if you feel sudden pain or your pain gets worse. Do not begin these exercises until told by your health care provider. Stretching exercises External rotation and abduction This exercise is sometimes called corner stretch. The exercise rotates your arm outward (external rotation) and moves your arm out from your body (abduction). Stand in a doorway with one of your feet slightly in front of the other. This is called a staggered stance. If you cannot reach your forearms to the door frame, stand facing a corner of a room. Choose one of the following positions as told by your health care provider: Place your hands and forearms on the door frame above your head. Place your hands and forearms on the door frame at the height of your head. Place your hands on the door frame at the height of your elbows. Slowly move your weight onto your front foot until you feel a stretch across your chest and in the front of your shoulders. Keep your head and chest upright and keep your abdominal muscles tight. Hold for __________ seconds. To release the stretch, shift your weight to your back foot. Repeat __________ times. Complete this exercise __________ times a day. Extension, standing  Stand and hold a broomstick, a cane, or a similar object behind your back. Your hands should be a little wider than shoulder-width apart. Your palms should face away from your back. Keeping your elbows straight and your shoulder muscles  relaxed, move the stick away from your body until you feel a stretch in your shoulders (extension). Avoid shrugging your shoulders while you move the stick. Keep your shoulder blades tucked down toward the middle of your back. Hold for __________ seconds. Slowly return to the starting position. Repeat __________ times. Complete this exercise __________ times a day. Range-of-motion exercises Pendulum  Stand near a wall or a surface that you can hold onto for balance. Bend at the waist and let your left / right arm hang straight down. Use your other arm to support you. Keep your back straight and do not lock your knees. Relax your left / right arm and shoulder muscles, and move your hips and your trunk so your left / right arm swings freely. Your arm should swing because of the motion of your body, not because you are using your arm or shoulder muscles. Keep moving your hips and trunk so your arm swings in the following directions, as told by your health care provider: Side to side. Forward and backward. In clockwise and counterclockwise circles. Continue each motion for __________ seconds, or for as long as told by your health care provider. Slowly return to the starting position. Repeat __________ times. Complete this exercise __________ times a day. Shoulder flexion, standing  Stand and hold a broomstick, a cane, or a similar object. Place your hands a little more than shoulder-width apart on the object. Your left / right hand should be palm-up, and your other hand should be palm-down. Keep your elbow straight and your shoulder muscles relaxed. Push the  stick up with your healthy arm to raise your left / right arm in front of your body, and then over your head until you feel a stretch in your shoulder (flexion). Avoid shrugging your shoulder while you raise your arm. Keep your shoulder blade tucked down toward the middle of your back. Hold for __________ seconds. Slowly return to the  starting position. Repeat __________ times. Complete this exercise __________ times a day. Shoulder abduction, standing  Stand and hold a broomstick, a cane, or a similar object. Place your hands a little more than shoulder-width apart on the object. Your left / right hand should be palm-up, and your other hand should be palm-down. Keep your elbow straight and your shoulder muscles relaxed. Push the object across your body toward your left / right side. Raise your left / right arm to the side of your body (abduction) until you feel a stretch in your shoulder. Do not raise your arm above shoulder height unless your health care provider tells you to do that. If directed, raise your arm over your head. Avoid shrugging your shoulder while you raise your arm. Keep your shoulder blade tucked down toward the middle of your back. Hold for __________ seconds. Slowly return to the starting position. Repeat __________ times. Complete this exercise __________ times a day. Internal rotation  Place your left / right hand behind your back, palm-up. Use your other hand to dangle an exercise band, a broomstick, or a similar object over your shoulder. Grasp the band with your left / right hand so you are holding on to both ends. Gently pull up on the band until you feel a stretch in the front of your left / right shoulder. The movement of your arm toward the center of your body is called internal rotation. Avoid shrugging your shoulder while you raise your arm. Keep your shoulder blade tucked down toward the middle of your back. Hold for __________ seconds. Release the stretch by letting go of the band and lowering your hands. Repeat __________ times. Complete this exercise __________ times a day. Strengthening exercises External rotation  Sit in a stable chair without armrests. Secure an exercise band to a stable object at elbow height on your left / right side. Place a soft object, such as a folded towel  or a small pillow, between your left / right upper arm and your body to move your elbow about 4 inches (10 cm) away from your side. Hold the end of the exercise band so it is tight and there is no slack. Keeping your elbow pressed against the soft object, slowly move your forearm out, away from your abdomen (external rotation). Keep your body steady so only your forearm moves. Hold for __________ seconds. Slowly return to the starting position. Repeat __________ times. Complete this exercise __________ times a day. Shoulder abduction  Sit in a stable chair without armrests, or stand up. Hold a __________ lb / kg weight in your left / right hand, or hold an exercise band with both hands. Start with your arms straight down and your left / right palm facing in, toward your body. Slowly lift your left / right hand out to your side (abduction). Do not lift your hand above shoulder height unless your health care provider tells you that this is safe. Keep your arms straight. Avoid shrugging your shoulder while you do this movement. Keep your shoulder blade tucked down toward the middle of your back. Hold for __________ seconds. Slowly lower your  arm, and return to the starting position. Repeat __________ times. Complete this exercise __________ times a day. Shoulder extension  Sit in a stable chair without armrests, or stand up. Secure an exercise band to a stable object in front of you so it is at shoulder height. Hold one end of the exercise band in each hand. Straighten your elbows and lift your hands up to shoulder height. Squeeze your shoulder blades together as you pull your hands down to the sides of your thighs (extension). Stop when your hands are straight down by your sides. Do not let your hands go behind your body. Hold for __________ seconds. Slowly return to the starting position. Repeat __________ times. Complete this exercise __________ times a day. Shoulder row  Sit in a stable  chair without armrests, or stand up. Secure an exercise band to a stable object in front of you so it is at chest height. Hold one end of the exercise band in each hand. Position your palms so that your thumbs are facing the ceiling (neutral position). Bend each of your elbows to a 90-degree angle (right angle) and keep your upper arms at your sides. Step back or move the chair back until the band is tight and there is no slack. Slowly pull your elbows back behind you. Hold for __________ seconds. Slowly return to the starting position. Repeat __________ times. Complete this exercise __________ times a day. Shoulder press-ups  Sit in a stable chair that has armrests. Sit upright, with your feet flat on the floor. Put your hands on the armrests so your elbows are bent and your fingers are pointing forward. Your hands should be about even with the sides of your body. Push down on the armrests and use your arms to lift yourself off the chair. Straighten your elbows and lift yourself up as much as you comfortably can. Move your shoulder blades down, and avoid letting your shoulders move up toward your ears. Keep your feet on the ground. As you get stronger, your feet should support less of your body weight as you lift yourself up. Hold for __________ seconds. Slowly lower yourself back into the chair. Repeat __________ times. Complete this exercise __________ times a day. Wall push-ups  Stand so you are facing a stable wall. Your feet should be about one arm-length away from the wall. Lean forward and place your palms on the wall at shoulder height. Keep your feet flat on the floor as you bend your elbows and lean forward toward the wall. Hold for __________ seconds. Straighten your elbows to push yourself back to the starting position. Repeat __________ times. Complete this exercise __________ times a day. This information is not intended to replace advice given to you by your health care  provider. Make sure you discuss any questions you have with your health care provider. Document Revised: 06/15/2021 Document Reviewed: 06/15/2021 Elsevier Patient Education  [place].  Neck Exercises Ask your health care provider which exercises are safe for you. Do exercises exactly as told by your health care provider and adjust them as directed. It is normal to feel mild stretching, pulling, tightness, or discomfort as you do these exercises. Stop right away if you feel sudden pain or your pain gets worse. Do not begin these exercises until told by your health care provider. Neck exercises can be important for many reasons. They can improve strength and maintain flexibility in your neck, which will help your upper back and prevent neck pain. Stretching  exercises Rotation neck stretching  Sit in a chair or stand up. Place your feet flat on the floor, shoulder-width apart. Slowly turn your head (rotate) to the right until a slight stretch is felt. Turn it all the way to the right so you can look over your right shoulder. Do not tilt or tip your head. Hold this position for 10-30 seconds. Slowly turn your head (rotate) to the left until a slight stretch is felt. Turn it all the way to the left so you can look over your left shoulder. Do not tilt or tip your head. Hold this position for 10-30 seconds. Repeat __________ times. Complete this exercise __________ times a day. Neck retraction  Sit in a sturdy chair or stand up. Look straight ahead. Do not bend your neck. Use your fingers to push your chin backward (retraction). Do not bend your neck for this movement. Continue to face straight ahead. If you are doing the exercise properly, you will feel a slight sensation in your throat and a stretch at the back of your neck. Hold the stretch for 1-2 seconds. Repeat __________ times. Complete this exercise __________ times a day. Strengthening exercises Neck press  Lie on your back on  a firm bed or on the floor with a pillow under your head. Use your neck muscles to push your head down on the pillow and straighten your spine. Hold the position as well as you can. Keep your head facing up (in a neutral position) and your chin tucked. Slowly count to 5 while holding this position. Repeat __________ times. Complete this exercise __________ times a day. Isometrics These are exercises in which you strengthen the muscles in your neck while keeping your neck still (isometrics). Sit in a supportive chair and place your hand on your forehead. Keep your head and face facing straight ahead. Do not flex or extend your neck while doing isometrics. Push forward with your head and neck while pushing back with your hand. Hold for 10 seconds. Do the sequence again, this time putting your hand against the back of your head. Use your head and neck to push backward against the hand pressure. Finally, do the same exercise on either side of your head, pushing sideways against the pressure of your hand. Repeat __________ times. Complete this exercise __________ times a day. Prone head lifts  Lie face-down (prone position), resting on your elbows so that your chest and upper back are raised. Start with your head facing downward, near your chest. Position your chin either on or near your chest. Slowly lift your head upward. Lift until you are looking straight ahead. Then continue lifting your head as far back as you can comfortably stretch. Hold your head up for 5 seconds. Then slowly lower it to your starting position. Repeat __________ times. Complete this exercise __________ times a day. Supine head lifts  Lie on your back (supine position), bending your knees to point to the ceiling and keeping your feet flat on the floor. Lift your head slowly off the floor, raising your chin toward your chest. Hold for 5 seconds. Repeat __________ times. Complete this exercise __________ times a  day. Scapular retraction  Stand with your arms at your sides. Look straight ahead. Slowly pull both shoulders (scapulae) backward and downward (retraction) until you feel a stretch between your shoulder blades in your upper back. Hold for 10-30 seconds. Relax and repeat. Repeat __________ times. Complete this exercise __________ times a day. Contact a health care provider  if: Your neck pain or discomfort gets worse when you do an exercise. Your neck pain or discomfort does not improve within 2 hours after you exercise. If you have any of these problems, stop exercising right away. Do not do the exercises again unless your health care provider says that you can. Get help right away if: You develop sudden, severe neck pain. If this happens, stop exercising right away. Do not do the exercises again unless your health care provider says that you can. This information is not intended to replace advice given to you by your health care provider. Make sure you discuss any questions you have with your health care provider. Document Revised: 10/20/2020 Document Reviewed: 10/20/2020 Elsevier Patient Education  [place].

## 2022-06-13 NOTE — Assessment & Plan Note (Signed)
Repeat lipid panel ?

## 2022-06-13 NOTE — Assessment & Plan Note (Signed)
Persistent fatigue ?Repeat vit. D ?

## 2022-06-13 NOTE — Assessment & Plan Note (Signed)
Worse 2weeks ago, associated with right shoulder and neck pain, eval by urgent care provider (NSAID, oral prednisone and muscle relaxant prescribed). Symptoms have improved but not resolved. Denies any trauma Current job requires prolonged sedentary periods (sitting at a desk) She is right hand dominant. No paresthesia, no weakness, no atropthy Dg cervical spine 05/2022:no DDD, Loss of normal cervical lordosis.  Entered referral to outpatient PT. Provided home exercises fro neck and shoulder Advised to continue use of NSAIDs and muscle relaxant as prescribed. F/up in 69month

## 2022-06-13 NOTE — Progress Notes (Signed)
Established Patient Visit  Patient: Leslie Bell   DOB: [birth year]   32 y.o. Female  MRN: [id number] Visit Date: 06/13/2022  Subjective:    Chief Complaint  Patient presents with   Follow-up    [facility] follow up. Patient states the neck pain has turned into discomfort and shoulder pain.    HPI Class 3 severe obesity due to excess calories with serious comorbidity and body mass index (BMI) of 40.0 to 44.9 in adult [facility]) Stopped wegovy x 12months due to drug shortage Gained up to 247lbs Start wegovy dose 4weeks ago, lost 10lbs since then Denies any adverse side effects Wt Readings from Last 3 Encounters:  06/13/22 237 lb (107.5 kg)  03/28/22 235 lb 9.6 oz (106.9 kg)  02/25/22 230 lb (104.3 kg)    Advised to maintain heart healthy diet and daily exercise (cardio and strength training) Increase wegovy to 2.4mg  weekly F/up in 59months   Depressive disorder Improved and stable Meets with therapist weekly: Shaniqua harris. Daily use of effexor without any adverse effects  Mixed hyperlipidemia Repeat lipid panel  Prediabetes Repeat hgbA1c  Vitamin D deficiency Persistent fatigue Repeat vit. D  Mid back pain, chronic Worse 2weeks ago, associated with right shoulder and neck pain, eval by urgent care provider (NSAID, oral prednisone and muscle relaxant prescribed). Symptoms have improved but not resolved. Denies any trauma Current job requires prolonged sedentary periods (sitting at a desk) She is right hand dominant. No paresthesia, no weakness, no atropthy Dg cervical spine 05/2022:no DDD, Loss of normal cervical lordosis.  Entered referral to outpatient PT. Provided home exercises fro neck and shoulder Advised to continue use of NSAIDs and muscle relaxant as prescribed. F/up in 20month  Neck and shoulder pain have improved with use of NSAIDs, muscle relaxant. Mild pain with tension headache and dizziness. Relief with naproxen or exedrin  Wt Readings from  Last 3 Encounters:  06/13/22 237 lb (107.5 kg)  03/28/22 235 lb 9.6 oz (106.9 kg)  02/25/22 230 lb (104.3 kg)    BP Readings from Last 3 Encounters:  06/13/22 122/82  05/24/22 124/82  03/28/22 128/87    Reviewed medical, surgical, and social history today  Medications: Outpatient Medications Prior to Visit  Medication Sig   Cholecalciferol (VITAMIN D3) 125 MCG (5000 UT) CAPS Take 1 capsule (5,000 Units total) by mouth daily.   ketoconazole (NIZORAL) 2 % shampoo Apply 1 Application topically 2 (two) times a week.   naproxen (NAPROSYN) 500 MG tablet Take 1 tablet (500 mg total) by mouth 2 (two) times daily with a meal.   tiZANidine (ZANAFLEX) 4 MG tablet Take 1 tablet (4 mg total) by mouth at bedtime.   traZODone (DESYREL) 50 MG tablet Take 0.5-1 tablets (25-50 mg total) by mouth at bedtime as needed for sleep.   venlafaxine XR (EFFEXOR-XR) 150 MG 24 hr capsule Take 1 capsule (150 mg total) by mouth daily with breakfast.   [DISCONTINUED] Semaglutide, 1 MG/DOSE, 4 MG/3ML SOPN Inject 1 mg as directed once a week.   [DISCONTINUED] famotidine (PEPCID) 20 MG tablet Take 1 tablet (20 mg total) by mouth 2 (two) times daily. (Patient not taking: Reported on 06/13/2022)   [DISCONTINUED] norgestimate-ethinyl estradiol (ORTHO-CYCLEN) 0.25-35 MG-MCG tablet Take 1 tablet by mouth daily. (Patient not taking: Reported on 06/13/2022)   [DISCONTINUED] predniSONE (DELTASONE) 20 MG tablet Day 1-5: Take 2 tablets daily. Day 6-10: Take 1 tablet daily. Take tablets with breakfast. (  Patient not taking: Reported on 06/13/2022)   No facility-administered medications prior to visit.   Reviewed past medical and social history.   ROS per HPI above      Objective:  BP 122/82 (BP Location: Right Arm, Patient Position: Sitting, Cuff Size: Large)   Pulse 74   Temp 97.8 F (36.6 C) (Temporal)   Wt 237 lb (107.5 kg)   LMP 03/21/2022 Comment: not sexaully active  SpO2 99%   BMI 44.06 kg/m      Physical  Exam Vitals reviewed.  Constitutional:      General: She is not in acute distress. Cardiovascular:     Rate and Rhythm: Normal rate and regular rhythm.     Pulses: Normal pulses.     Heart sounds: Normal heart sounds.  Pulmonary:     Effort: Pulmonary effort is normal.     Breath sounds: Normal breath sounds.  Musculoskeletal:     Right shoulder: Normal.     Left shoulder: Normal.     Right upper arm: Normal.     Left upper arm: Normal.     Cervical back: Normal range of motion and neck supple. No rigidity, spasms, torticollis or crepitus. Pain with movement present. Normal range of motion.     Thoracic back: Spasms and tenderness present. No deformity. Normal range of motion.     Right lower leg: No edema.     Left lower leg: No edema.  Lymphadenopathy:     Cervical: No cervical adenopathy.  Neurological:     Mental Status: She is alert and oriented to person, place, and time.  Psychiatric:        Mood and Affect: Mood normal.        Behavior: Behavior normal.        Thought Content: Thought content normal.     No results found for any visits on 06/13/22.    Assessment & Plan:    Problem List Items Addressed This Visit       Other   Class 3 severe obesity due to excess calories with serious comorbidity and body mass index (BMI) of 40.0 to 44.9 in adult ([place])    Stopped wegovy x 72months due to drug shortage Gained up to 247lbs Start wegovy dose 4weeks ago, lost 10lbs since then Denies any adverse side effects Wt Readings from Last 3 Encounters:  06/13/22 237 lb (107.5 kg)  03/28/22 235 lb 9.6 oz (106.9 kg)  02/25/22 230 lb (104.3 kg)    Advised to maintain heart healthy diet and daily exercise (cardio and strength training) Increase wegovy to 2.4mg  weekly F/up in 66months       Relevant Medications   Semaglutide-Weight Management (WEGOVY) 2.4 MG/0.75ML SOAJ   Other Relevant Orders   Comprehensive metabolic panel   Depressive disorder - Primary    Improved and  stable Meets with therapist weekly: Shaniqua harris. Daily use of effexor without any adverse effects      Mid back pain, chronic    Worse 2weeks ago, associated with right shoulder and neck pain, eval by urgent care provider (NSAID, oral prednisone and muscle relaxant prescribed). Symptoms have improved but not resolved. Denies any trauma Current job requires prolonged sedentary periods (sitting at a desk) She is right hand dominant. No paresthesia, no weakness, no atropthy Dg cervical spine 05/2022:no DDD, Loss of normal cervical lordosis.  Entered referral to outpatient PT. Provided home exercises fro neck and shoulder Advised to continue use of NSAIDs and muscle relaxant as  prescribed. F/up in 75month      Mixed hyperlipidemia    Repeat lipid panel      Relevant Orders   Lipid panel   Prediabetes    Repeat hgbA1c      Relevant Orders   Hemoglobin A1c   Vitamin D deficiency    Persistent fatigue Repeat vit. D      Relevant Orders   VITAMIN D 25 Hydroxy (Vit-D Deficiency, Fractures)   Return in about 4 weeks (around 07/11/2022) for Weight management.     Wilfred Lacy, NP

## 2022-06-13 NOTE — Assessment & Plan Note (Signed)
Repeat hgbA1c 

## 2022-06-13 NOTE — Assessment & Plan Note (Addendum)
Stopped wegovy x 76months due to drug shortage Gained up to 247lbs Start wegovy dose 4weeks ago, lost 10lbs since then Denies any adverse side effects Wt Readings from Last 3 Encounters:  06/13/22 237 lb (107.5 kg)  03/28/22 235 lb 9.6 oz (106.9 kg)  02/25/22 230 lb (104.3 kg)    Advised to maintain heart healthy diet and daily exercise (cardio and strength training) Increase wegovy to 2.4mg  weekly F/up in 67months

## 2022-06-16 ENCOUNTER — Encounter: Payer: Self-pay | Admitting: Nurse Practitioner

## 2022-06-17 ENCOUNTER — Telehealth: Payer: Self-pay

## 2022-06-17 NOTE — Telephone Encounter (Signed)
Pharmacy Patient Advocate Encounter  Received notification from [facility] that the request for prior authorization for Ozempic has been denied due to .    Please be advised we currently do not have a Pharmacist to review denials, therefore you will need to process appeals accordingly as needed. Thanks for your support at this time.

## 2022-06-21 DIAGNOSIS — F4320 Adjustment disorder, unspecified: Secondary | ICD-10-CM | POA: Diagnosis not present

## 2022-06-24 NOTE — Therapy (Signed)
OUTPATIENT PHYSICAL THERAPY CERVICAL EVALUATION   Patient Name: Leslie Bell MRN: [id number] DOB:1991/02/09, 32 y.o., female Today's Date: 06/27/2022  END OF SESSION:  PT End of Session - 06/27/22 1530     Visit Number 1    Date for PT Re-Evaluation 08/01/22    Authorization Type BCBS    PT Start Time 1530    PT Stop Time 1610    PT Time Calculation (min) 40 min    Activity Tolerance Patient tolerated treatment well             Past Medical History:  Diagnosis Date   Depression    PCOS (polycystic ovarian syndrome)    History reviewed. No pertinent surgical history. Patient Active Problem List   Diagnosis Date Noted   Mixed hyperlipidemia 05/19/2021   Tinea versicolor 05/19/2021   Class 3 severe obesity due to excess calories with serious comorbidity and body mass index (BMI) of 40.0 to 44.9 in adult [facility]) 08/15/2020   Prediabetes 08/15/2020   Calculus of gallbladder without cholecystitis without obstruction 08/14/2020   Mid back pain, chronic 12/12/2019   Vitamin D deficiency 03/19/2019   Depressive disorder 03/19/2019   GAD (generalized anxiety disorder) 03/19/2019   Goiter 03/19/2019   Polycystic ovaries 03/11/2019    PCP: Wilfred Lacy  REFERRING PROVIDER: Wilfred Lacy  REFERRING DIAG: [phone] (ICD-10-CM) - Trapezius muscle spasm   THERAPY DIAG:  Cervicalgia  Abnormal posture  Tension headache  Muscle weakness (generalized)  Rationale for Evaluation and Treatment: Rehabilitation  ONSET DATE: 05/24/22  SUBJECTIVE:                                                                                                                                                                                                         SUBJECTIVE STATEMENT: I started having a lot of neck pain back in December. I thought I pulled muscle working out. After a month it didn't go away and got worse. It is not as painful anymore but I feel like I have of weakness. I have  started back working out and I was trying to do a shoulder exercise and I tweaked it a little bit.   PERTINENT HISTORY:  No remarkable headaches  PAIN:  Are you having pain? Yes: NPRS scale: 7/10 Pain location: neck and headache, sometimes midback Pain description: ache, dull, comes and goes Aggravating factors: busy day, sleeping in wrong positions  Relieving factors: heat and stretching  PRECAUTIONS: None  WEIGHT BEARING RESTRICTIONS: No  FALLS:  Has patient fallen in last 6 months? 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 Preserved vertebral body height, disc height, prevertebral soft tissues. No listhesis. Preserved bone mineralization. Oblique views are under rotated limiting evaluation of the neural foramen. Neural foramen that are seen are without significant osseous stenosis. Overall if there is further concern additional cross-sectional imaging study could be performed as clinically indicated.   IMPRESSION: Loss of normal cervical lordosis.  COGNITION: Overall cognitive status: Within functional limits for tasks assessed  SENSATION: WFL  POSTURE: rounded shoulders and increased thoracic kyphosis  PALPATION: Trigger points in R upper trap, TTP and jumps with light pressure    CERVICAL ROM:   Active ROM A/PROM (deg) eval  Flexion WFL  Extension WFL  Right lateral flexion WFL  Left lateral flexion WFL  Right rotation WFL  Left rotation Catching sensation at end   (Blank rows = not tested)  UPPER EXTREMITY ROM: grossly WFL  UPPER EXTREMITY MMT: WNL   CERVICAL SPECIAL TESTS:  Spurling's test: Negative and Distraction test:  Negative  TODAY'S TREATMENT:                                                                                                                              DATE: 06/27/22- EVAL   PATIENT EDUCATION:  Education details: HEP and POC Person educated: Patient Education method: Explanation Education comprehension: verbalized understanding  HOME EXERCISE PROGRAM: Access Code: [email or web address] URL: [email or web address] Date: 06/27/2022 Prepared by: Andris Baumann  Exercises - Seated Upper Trapezius Stretch  - 2 x daily - 7 x weekly - 15-30 hold - Seated Levator Scapulae Stretch  - 2 x daily - 7 x weekly - 15-30 hold - Eccentric Deep Neck Flexor Training  - 2 x daily - 7 x weekly - 2 sets - 5 reps - 15-30 hold - Doorway Pec Stretch at 90 Degrees Abduction  - 2 x daily - 7 x weekly - 15-30 hold  ASSESSMENT:  CLINICAL IMPRESSION: Patient is a 32 y.o. female who was seen today for physical therapy evaluation and treatment for head and neck pain. She states her pain started in December and gradually got worse. She was told she had a possible pinched nerve. At the time of eval she states her pain is a lot better as she has tried massage and cupping. She does still have some trigger points especially in R upper trap that were TTP and press on. She presents with good overall strength but would like to return to PLOF lifting overhead without feeling like her arms get tired. We agreed to try 4 weeks of therapy once a week to work on her tightness and try dry needling to see if that  will help relieve her pain and headaches. She is getting headaches at least 3x a week which she said is not normal for her and has been a new occurrence since all of this started in December. These are possibly tension headaches that may be referred from trigger points.   OBJECTIVE IMPAIRMENTS: increased muscle spasms and pain.   REHAB POTENTIAL: Good  CLINICAL DECISION MAKING: Stable/uncomplicated  EVALUATION COMPLEXITY:  Low   GOALS: Goals reviewed with patient? Yes  SHORT TERM GOALS: Target date: 07/18/22  Patient will be independent with initial HEP.  Goal status: INITIAL   LONG TERM GOALS: Target date: 08/01/22  Patient will be independent with advanced/ongoing HEP to improve outcomes and carryover.  Goal status: INITIAL  2.  Patient will report 75% improvement in head and neck pain to improve QOL.  Baseline: gets up to a 7/10 Goal status: INITIAL  3.  Patient will report no headaches in a 4 week period  Baseline: at least 3x a week Goal status: INITIAL  4.  Patient will demonstrate improved posture to decrease muscle imbalance. Goal status: INITIAL  PLAN:  PT FREQUENCY: 1x/week  PT DURATION: 4 weeks  PLANNED INTERVENTIONS: Therapeutic exercises, Therapeutic activity, Neuromuscular re-education, Balance training, Gait training, Patient/Family education, Self Care, Joint mobilization, Dry Needling, Electrical stimulation, Cryotherapy, Moist heat, Taping, Traction, Ionotophoresis 27m/ml Dexamethasone, and Manual therapy  PLAN FOR NEXT SESSION: dry needling, stretching, maybe cervical traction, moist heat   MAndris Baumann PT 06/27/2022, 4:11 PM

## 2022-06-27 ENCOUNTER — Ambulatory Visit: Payer: BC Managed Care – PPO | Attending: Nurse Practitioner

## 2022-06-27 DIAGNOSIS — R293 Abnormal posture: Secondary | ICD-10-CM | POA: Diagnosis not present

## 2022-06-27 DIAGNOSIS — M6281 Muscle weakness (generalized): Secondary | ICD-10-CM | POA: Diagnosis not present

## 2022-06-27 DIAGNOSIS — M62838 Other muscle spasm: Secondary | ICD-10-CM | POA: Diagnosis not present

## 2022-06-27 DIAGNOSIS — M542 Cervicalgia: Secondary | ICD-10-CM | POA: Insufficient documentation

## 2022-06-27 DIAGNOSIS — G44209 Tension-type headache, unspecified, not intractable: Secondary | ICD-10-CM | POA: Insufficient documentation

## 2022-07-05 DIAGNOSIS — F4323 Adjustment disorder with mixed anxiety and depressed mood: Secondary | ICD-10-CM | POA: Diagnosis not present

## 2022-07-05 DIAGNOSIS — Z6841 Body Mass Index (BMI) 40.0 and over, adult: Secondary | ICD-10-CM | POA: Diagnosis not present

## 2022-07-05 DIAGNOSIS — E7800 Pure hypercholesterolemia, unspecified: Secondary | ICD-10-CM | POA: Diagnosis not present

## 2022-07-05 DIAGNOSIS — R635 Abnormal weight gain: Secondary | ICD-10-CM | POA: Diagnosis not present

## 2022-07-05 DIAGNOSIS — Z131 Encounter for screening for diabetes mellitus: Secondary | ICD-10-CM | POA: Diagnosis not present

## 2022-07-05 DIAGNOSIS — R5382 Chronic fatigue, unspecified: Secondary | ICD-10-CM | POA: Diagnosis not present

## 2022-07-05 DIAGNOSIS — E282 Polycystic ovarian syndrome: Secondary | ICD-10-CM | POA: Diagnosis not present

## 2022-07-05 DIAGNOSIS — E559 Vitamin D deficiency, unspecified: Secondary | ICD-10-CM | POA: Diagnosis not present

## 2022-07-06 ENCOUNTER — Other Ambulatory Visit (HOSPITAL_COMMUNITY): Payer: Self-pay

## 2022-07-06 ENCOUNTER — Telehealth: Payer: Self-pay

## 2022-07-06 NOTE — Telephone Encounter (Signed)
Pharmacy Patient Advocate Encounter   Received notification that prior authorization for [facility] 2.'4MG'$ /0.75ML auto-injectors is required/requested.  Per Test Claim: PA Required   PA submitted on 07/06/22 to (ins) [place] via CoverMyMeds Key [id number] Status is pending

## 2022-07-11 DIAGNOSIS — E282 Polycystic ovarian syndrome: Secondary | ICD-10-CM | POA: Diagnosis not present

## 2022-07-11 DIAGNOSIS — E88819 Insulin resistance, unspecified: Secondary | ICD-10-CM | POA: Diagnosis not present

## 2022-07-11 DIAGNOSIS — R4189 Other symptoms and signs involving cognitive functions and awareness: Secondary | ICD-10-CM | POA: Diagnosis not present

## 2022-07-11 DIAGNOSIS — Z6841 Body Mass Index (BMI) 40.0 and over, adult: Secondary | ICD-10-CM | POA: Diagnosis not present

## 2022-07-11 DIAGNOSIS — Z1339 Encounter for screening examination for other mental health and behavioral disorders: Secondary | ICD-10-CM | POA: Diagnosis not present

## 2022-07-11 DIAGNOSIS — Z1331 Encounter for screening for depression: Secondary | ICD-10-CM | POA: Diagnosis not present

## 2022-07-11 NOTE — Telephone Encounter (Signed)
Received fax for additional information. Completed and faxed back to insurance.

## 2022-07-13 ENCOUNTER — Other Ambulatory Visit (HOSPITAL_COMMUNITY): Payer: Self-pay

## 2022-07-13 NOTE — Telephone Encounter (Signed)
Patient Advocate Encounter  Prior Authorization for [employer] 2.'4MG'$ /0.75ML auto-injectors has been approved.     Authorization Expiration Date: November 09, 2022

## 2022-07-18 ENCOUNTER — Ambulatory Visit: Payer: BC Managed Care – PPO | Admitting: Nurse Practitioner

## 2022-07-26 DIAGNOSIS — F4323 Adjustment disorder with mixed anxiety and depressed mood: Secondary | ICD-10-CM | POA: Diagnosis not present

## 2022-08-02 ENCOUNTER — Ambulatory Visit
Admission: EM | Admit: 2022-08-02 | Discharge: 2022-08-02 | Disposition: A | Discharge status: Home / Self Care | Payer: BC Managed Care – PPO | Attending: Urgent Care | Admitting: Urgent Care

## 2022-08-02 DIAGNOSIS — R070 Pain in throat: Secondary | ICD-10-CM | POA: Diagnosis not present

## 2022-08-02 DIAGNOSIS — B9789 Other viral agents as the cause of diseases classified elsewhere: Secondary | ICD-10-CM | POA: Diagnosis not present

## 2022-08-02 DIAGNOSIS — J988 Other specified respiratory disorders: Secondary | ICD-10-CM | POA: Insufficient documentation

## 2022-08-02 DIAGNOSIS — Z113 Encounter for screening for infections with a predominantly sexual mode of transmission: Secondary | ICD-10-CM | POA: Insufficient documentation

## 2022-08-02 LAB — POCT RAPID STREP A (OFFICE): Rapid Strep A Screen: NEGATIVE

## 2022-08-02 MED ORDER — PSEUDOEPHEDRINE HCL 60 MG PO TABS: 60.0000 mg | ORAL_TABLET | Freq: Three times a day (TID) | ORAL | 0 refills | Status: DC | PRN

## 2022-08-02 MED ORDER — CETIRIZINE HCL 10 MG PO TABS: 10.0000 mg | ORAL_TABLET | Freq: Every day | ORAL | 0 refills | Status: DC

## 2022-08-02 MED ORDER — PROMETHAZINE-DM 6.25-15 MG/5ML PO SYRP: 5.0000 mL | ORAL_SOLUTION | Freq: Three times a day (TID) | ORAL | 0 refills | Status: DC | PRN

## 2022-08-02 NOTE — ED Provider Notes (Signed)
[facility]  Note:  This document was prepared using Systems analyst and may include unintentional dictation errors.  MRN: [id number] DOB: [birth year]  Subjective:   Leslie Bell is a 32 y.o. female presenting for 49 history of acute onset persistent mild to moderate throat pain, dry coughing, having coughing fits.  No fever, chest pain, shortness of breath or wheezing.  Patient did have unprotected oral sex and would like to be checked for oral STIs.  No current facility-administered medications for this encounter.  Current Outpatient Medications:    liothyronine (CYTOMEL) 5 MCG tablet, Take by mouth., Disp: , Rfl:    medroxyPROGESTERone (PROVERA) 10 MG tablet, Take 10 mg by mouth daily., Disp: , Rfl:    Cholecalciferol (VITAMIN D3) 125 MCG (5000 UT) CAPS, Take 1 capsule (5,000 Units total) by mouth daily., Disp: 90 capsule, Rfl: 0   ketoconazole (NIZORAL) 2 % shampoo, Apply 1 Application topically 2 (two) times a week., Disp: 120 mL, Rfl: 3   naproxen (NAPROSYN) 500 MG tablet, Take 1 tablet (500 mg total) by mouth 2 (two) times daily with a meal., Disp: 30 tablet, Rfl: 0   Semaglutide-Weight Management (WEGOVY) 2.4 MG/0.75ML SOAJ, Inject 2.4 mg into the skin once a week., Disp: 3 mL, Rfl: 1   tiZANidine (ZANAFLEX) 4 MG tablet, Take 1 tablet (4 mg total) by mouth at bedtime., Disp: 30 tablet, Rfl: 0   traZODone (DESYREL) 50 MG tablet, Take 0.5-1 tablets (25-50 mg total) by mouth at bedtime as needed for sleep., Disp: 30 tablet, Rfl: 5   venlafaxine XR (EFFEXOR-XR) 150 MG 24 hr capsule, Take 1 capsule (150 mg total) by mouth daily with breakfast., Disp: 90 capsule, Rfl: 1   No Known Allergies  Past Medical History:  Diagnosis Date   Depression    PCOS (polycystic ovarian syndrome)      History reviewed. No pertinent surgical history.  Family History  Problem Relation Age of Onset   Hypertension Mother    Hypertension Father    Alcohol  abuse Father    Diabetes Maternal Grandmother    Kidney disease Maternal Grandmother    Stroke Maternal Grandmother    Stroke Maternal Grandfather    Drug abuse Paternal Grandmother    Heart disease Paternal Grandmother    Cancer Paternal Grandfather 70       unknown    Social History   Tobacco Use   Smoking status: Never   Smokeless tobacco: Never  Vaping Use   Vaping Use: Never used  Substance Use Topics   Alcohol use: Yes    Alcohol/week: 1.0 standard drink of alcohol    Types: 1 Glasses of wine per week    Comment: once a week   Drug use: Never    ROS   Objective:   Vitals: BP 106/76 (BP Location: Right Arm)   Pulse 84   Temp 99 F (37.2 C) (Oral)   LMP 01/31/2022 (Within Days) Comment: pt states this was her last know menstrual  SpO2 97%   Physical Exam Constitutional:      General: She is not in acute distress.    Appearance: Normal appearance. She is well-developed and normal weight. She is not ill-appearing, toxic-appearing or diaphoretic.  HENT:     Head: Normocephalic and atraumatic.     Right Ear: Tympanic membrane, ear canal and external ear normal. No drainage or tenderness. No middle ear effusion. There is no impacted cerumen. Tympanic membrane is not erythematous or  bulging.     Left Ear: Tympanic membrane, ear canal and external ear normal. No drainage or tenderness.  No middle ear effusion. There is no impacted cerumen. Tympanic membrane is not erythematous or bulging.     Nose: Nose normal. No congestion or rhinorrhea.     Mouth/Throat:     Mouth: Mucous membranes are moist. No oral lesions.     Pharynx: No pharyngeal swelling, oropharyngeal exudate, posterior oropharyngeal erythema or uvula swelling.     Tonsils: No tonsillar exudate or tonsillar abscesses.  Eyes:     General: No scleral icterus.       Right eye: No discharge.        Left eye: No discharge.     Extraocular Movements: Extraocular movements intact.     Right eye: Normal  extraocular motion.     Left eye: Normal extraocular motion.     Conjunctiva/sclera: Conjunctivae normal.  Cardiovascular:     Rate and Rhythm: Normal rate and regular rhythm.     Heart sounds: Normal heart sounds. No murmur heard.    No friction rub. No gallop.  Pulmonary:     Effort: Pulmonary effort is normal. No respiratory distress.     Breath sounds: No stridor. No wheezing, rhonchi or rales.  Chest:     Chest wall: No tenderness.  Musculoskeletal:     Cervical back: Normal range of motion and neck supple.  Lymphadenopathy:     Cervical: No cervical adenopathy.  Skin:    General: Skin is warm and dry.  Neurological:     General: No focal deficit present.     Mental Status: She is alert and oriented to person, place, and time.  Psychiatric:        Mood and Affect: Mood normal.        Behavior: Behavior normal.    Results for orders placed or performed during the hospital encounter of 08/02/22 (from the past 24 hour(s))  POCT rapid strep A     Status: None   Collection Time: 08/02/22  2:06 PM  Result Value Ref Range   Rapid Strep A Screen Negative Negative    Assessment and Plan :   PDMP not reviewed this encounter.  1. Viral respiratory infection   2. Throat pain     Deferred imaging given clear cardiopulmonary exam, hemodynamically stable vital signs.  Oral cytology, strep culture pending.  Suspect viral URI, viral syndrome. Physical exam findings reassuring and vital signs stable for discharge. Advised supportive care, offered symptomatic relief. Counseled patient on potential for adverse effects with medications prescribed/recommended today, ER and return-to-clinic precautions discussed, patient verbalized understanding.     Jaynee Eagles, [place] 08/02/22 1421

## 2022-08-02 NOTE — Discharge Instructions (Signed)
We will manage this as a viral illness. For sore throat or cough try using a honey-based tea. Use 3 teaspoons of honey with juice squeezed from half lemon. Place shaved pieces of ginger into 1/2-1 cup of water and warm over stove top. Then mix the ingredients and repeat every 4 hours as needed. Please take ibuprofen 600mg  every 6 hours with food alternating with OR taken together with Tylenol 500mg -650mg  every 6 hours for throat pain, fevers, aches and pains. Hydrate very well with at least 2 liters of water. Eat light meals such as soups (chicken and noodles, vegetable, chicken and wild rice).  Do not eat foods that you are allergic to.  Taking an antihistamine like Zyrtec (10mg  daily) can help against postnasal drainage, sinus congestion which can cause sinus pain, sinus headaches, throat pain, painful swallowing, coughing.  You can take this together with pseudoephedrine (Sudafed) at a dose of 60 mg 3 times a day or twice daily as needed for the same kind of nasal drip, congestion.  Use cough syrup as needed.

## 2022-08-02 NOTE — ED Triage Notes (Signed)
Pt c/o sore throat, dry cough x 6days Pt denies fever  Coughing fits start at night  Pt has tried OTC Tylenol and Ibuprofen

## 2022-08-04 LAB — CYTOLOGY, (ORAL, ANAL, URETHRAL) ANCILLARY ONLY
Chlamydia: NEGATIVE
Comment: NEGATIVE
Comment: NEGATIVE
Comment: NORMAL
Neisseria Gonorrhea: NEGATIVE
Trichomonas: NEGATIVE

## 2022-08-05 LAB — CULTURE, GROUP A STREP (THRC)

## 2022-08-09 DIAGNOSIS — F4323 Adjustment disorder with mixed anxiety and depressed mood: Secondary | ICD-10-CM | POA: Diagnosis not present

## 2022-08-23 DIAGNOSIS — F4323 Adjustment disorder with mixed anxiety and depressed mood: Secondary | ICD-10-CM | POA: Diagnosis not present

## 2022-08-30 DIAGNOSIS — F4323 Adjustment disorder with mixed anxiety and depressed mood: Secondary | ICD-10-CM | POA: Diagnosis not present

## 2022-09-05 ENCOUNTER — Encounter: Payer: Self-pay | Admitting: Nurse Practitioner

## 2022-09-05 ENCOUNTER — Ambulatory Visit: Payer: BC Managed Care – PPO | Admitting: Nurse Practitioner

## 2022-09-05 VITALS — BP 110/70 | HR 91 | Temp 98.4°F | Resp 16 | Ht 61.5 in | Wt 236.0 lb

## 2022-09-05 DIAGNOSIS — R7303 Prediabetes: Secondary | ICD-10-CM

## 2022-09-05 DIAGNOSIS — Z6841 Body Mass Index (BMI) 40.0 and over, adult: Secondary | ICD-10-CM | POA: Diagnosis not present

## 2022-09-05 DIAGNOSIS — E782 Mixed hyperlipidemia: Secondary | ICD-10-CM | POA: Diagnosis not present

## 2022-09-05 MED ORDER — ZEPBOUND 5 MG/0.5ML ~~LOC~~ SOAJ: 5.0000 mg | SUBCUTANEOUS | 1 refills | Status: DC

## 2022-09-05 NOTE — Progress Notes (Signed)
Established Patient Visit  Patient: Leslie Bell   DOB: [birth year]   32 y.o. Female  MRN: [id number] Visit Date: 09/05/2022  Subjective:    Chief Complaint  Patient presents with   Medication change     Pt would like to change weight loss medication to Zepbound.  Reginal Lutes is not working    HPI Obesity, morbid (HCC) 1lb weight loss noted in last 2months with use of wegovy 2.4mg  weekly Total of 6lbs weight loss with wegovy in last 6months No consistent exercise regimen Continue to struggle with implementation of heart healthy diet.  She did not schedule appt with nutritionist as recommended Wt Readings from Last 3 Encounters:  09/05/22 236 lb (107 kg)  06/13/22 237 lb (107.5 kg)  03/28/22 235 lb 9.6 oz (106.9 kg)    Switched wegovy to zepbound 5mg  Advised about the importance of lifestyle modifications in combination with use of medications. Advised to schedule appt with nutritionist. F/up in 1-38months  Reviewed medical, surgical, and social history today  Medications: Outpatient Medications Prior to Visit  Medication Sig   cetirizine (ZYRTEC ALLERGY) 10 MG tablet Take 1 tablet (10 mg total) by mouth daily.   Cholecalciferol (VITAMIN D3) 125 MCG (5000 UT) CAPS Take 1 capsule (5,000 Units total) by mouth daily.   ketoconazole (NIZORAL) 2 % shampoo Apply 1 Application topically 2 (two) times a week.   tiZANidine (ZANAFLEX) 4 MG tablet Take 1 tablet (4 mg total) by mouth at bedtime.   traZODone (DESYREL) 50 MG tablet Take 0.5-1 tablets (25-50 mg total) by mouth at bedtime as needed for sleep.   venlafaxine XR (EFFEXOR-XR) 150 MG 24 hr capsule Take 1 capsule (150 mg total) by mouth daily with breakfast.   [DISCONTINUED] liothyronine (CYTOMEL) 5 MCG tablet Take by mouth.   [DISCONTINUED] medroxyPROGESTERone (PROVERA) 10 MG tablet Take 10 mg by mouth daily.   [DISCONTINUED] naproxen (NAPROSYN) 500 MG tablet Take 1 tablet (500 mg total) by mouth 2 (two) times daily with  a meal.   [DISCONTINUED] promethazine-dextromethorphan (PROMETHAZINE-DM) 6.25-15 MG/5ML syrup Take 5 mLs by mouth 3 (three) times daily as needed for cough.   [DISCONTINUED] pseudoephedrine (SUDAFED) 60 MG tablet Take 1 tablet (60 mg total) by mouth every 8 (eight) hours as needed for congestion.   [DISCONTINUED] Semaglutide-Weight Management (WEGOVY) 2.4 MG/0.75ML SOAJ Inject 2.4 mg into the skin once a week.   No facility-administered medications prior to visit.   Reviewed past medical and social history.   ROS per HPI above  Last metabolic panel Lab Results  Component Value Date   GLUCOSE 83 06/13/2022   NA 139 06/13/2022   K 4.5 06/13/2022   CL 103 06/13/2022   CO2 28 06/13/2022   BUN 8 06/13/2022   CREATININE 0.90 06/13/2022   GFRNONAA >60 08/09/2020   CALCIUM 9.6 06/13/2022   PROT 7.4 06/13/2022   ALBUMIN 4.5 06/13/2022   BILITOT 0.4 06/13/2022   ALKPHOS 61 06/13/2022   AST 15 06/13/2022   ALT 23 06/13/2022   ANIONGAP 9 08/09/2020   Last lipids Lab Results  Component Value Date   CHOL 199 06/13/2022   HDL 38.80 (L) 06/13/2022   LDLCALC 144 (H) 06/13/2022   TRIG 79.0 06/13/2022   CHOLHDL 5 06/13/2022   Last hemoglobin A1c Lab Results  Component Value Date   HGBA1C 5.5 06/13/2022        Objective:  BP 110/70 (BP Location: Right Arm,  Patient Position: Sitting, Cuff Size: Large)   Pulse 91   Temp 98.4 F (36.9 C) (Temporal)   Resp 16   Ht 5' 1.5" (1.562 m)   Wt 236 lb (107 kg)   LMP 07/08/2022 (Approximate)   SpO2 96%   BMI 43.87 kg/m      Physical Exam Cardiovascular:     Rate and Rhythm: Normal rate and regular rhythm.     Pulses: Normal pulses.     Heart sounds: Normal heart sounds.  Pulmonary:     Effort: Pulmonary effort is normal.     Breath sounds: Normal breath sounds.  Neurological:     Mental Status: She is alert and oriented to person, place, and time.     No results found for any visits on 09/05/22.    Assessment & Plan:     Problem List Items Addressed This Visit       Other   Mixed hyperlipidemia   Relevant Medications   tirzepatide (ZEPBOUND) 5 MG/0.5ML Pen   Obesity, morbid (HCC) - Primary    1lb weight loss noted in last 2months with use of wegovy 2.4mg  weekly Total of 6lbs weight loss with wegovy in last 6months No consistent exercise regimen Continue to struggle with implementation of heart healthy diet.  She did not schedule appt with nutritionist as recommended Wt Readings from Last 3 Encounters:  09/05/22 236 lb (107 kg)  06/13/22 237 lb (107.5 kg)  03/28/22 235 lb 9.6 oz (106.9 kg)    Switched wegovy to zepbound 5mg  Advised about the importance of lifestyle modifications in combination with use of medications. Advised to schedule appt with nutritionist. F/up in 1-5months      Relevant Medications   tirzepatide (ZEPBOUND) 5 MG/0.5ML Pen   Prediabetes   Relevant Medications   tirzepatide (ZEPBOUND) 5 MG/0.5ML Pen   Return in about 2 months (around 11/05/2022) for Weight management.     Alysia Penna, NP

## 2022-09-05 NOTE — Assessment & Plan Note (Addendum)
1lb weight loss noted in last 2months with use of wegovy 2.4mg  weekly Total of 6lbs weight loss with wegovy in last 6months No consistent exercise regimen Continue to struggle with implementation of heart healthy diet.  She did not schedule appt with nutritionist as recommended Wt Readings from Last 3 Encounters:  09/05/22 236 lb (107 kg)  06/13/22 237 lb (107.5 kg)  03/28/22 235 lb 9.6 oz (106.9 kg)    Switched wegovy to zepbound 5mg  Advised about the importance of lifestyle modifications in combination with use of medications. Advised to schedule appt with nutritionist. F/up in 1-43months

## 2022-09-06 DIAGNOSIS — F4323 Adjustment disorder with mixed anxiety and depressed mood: Secondary | ICD-10-CM | POA: Diagnosis not present

## 2022-09-08 ENCOUNTER — Other Ambulatory Visit: Payer: Self-pay | Admitting: Nurse Practitioner

## 2022-09-08 DIAGNOSIS — F321 Major depressive disorder, single episode, moderate: Secondary | ICD-10-CM

## 2022-09-08 DIAGNOSIS — F411 Generalized anxiety disorder: Secondary | ICD-10-CM

## 2022-09-12 ENCOUNTER — Telehealth: Payer: Self-pay

## 2022-09-12 ENCOUNTER — Encounter: Payer: Self-pay | Admitting: Nurse Practitioner

## 2022-09-12 NOTE — Telephone Encounter (Signed)
PA has been submitted, will be updated in additional encounter created.  

## 2022-09-12 NOTE — Telephone Encounter (Signed)
PA request received via provider for Zepbound 5MG /0.5ML pen-injectors  PA has been submitted to [facility] and is pending additional questions/determination  Key: [id number]

## 2022-09-21 ENCOUNTER — Other Ambulatory Visit: Payer: Self-pay | Admitting: Nurse Practitioner

## 2022-09-21 DIAGNOSIS — F411 Generalized anxiety disorder: Secondary | ICD-10-CM

## 2022-09-21 DIAGNOSIS — F321 Major depressive disorder, single episode, moderate: Secondary | ICD-10-CM

## 2022-09-21 NOTE — Telephone Encounter (Signed)
PA has been APPROVED. Approval letter has been attached in patients media

## 2022-09-25 ENCOUNTER — Other Ambulatory Visit: Payer: Self-pay | Admitting: Nurse Practitioner

## 2022-09-25 DIAGNOSIS — F321 Major depressive disorder, single episode, moderate: Secondary | ICD-10-CM

## 2022-09-25 DIAGNOSIS — F411 Generalized anxiety disorder: Secondary | ICD-10-CM

## 2022-10-13 ENCOUNTER — Other Ambulatory Visit: Payer: Self-pay | Admitting: Nurse Practitioner

## 2022-10-13 DIAGNOSIS — F321 Major depressive disorder, single episode, moderate: Secondary | ICD-10-CM

## 2022-10-13 DIAGNOSIS — F411 Generalized anxiety disorder: Secondary | ICD-10-CM

## 2022-10-18 DIAGNOSIS — F4323 Adjustment disorder with mixed anxiety and depressed mood: Secondary | ICD-10-CM | POA: Diagnosis not present

## 2022-10-26 DIAGNOSIS — F4323 Adjustment disorder with mixed anxiety and depressed mood: Secondary | ICD-10-CM | POA: Diagnosis not present

## 2022-11-01 DIAGNOSIS — F4323 Adjustment disorder with mixed anxiety and depressed mood: Secondary | ICD-10-CM | POA: Diagnosis not present

## 2022-11-04 ENCOUNTER — Ambulatory Visit: Payer: BC Managed Care – PPO | Admitting: Nurse Practitioner

## 2022-11-09 IMAGING — US US ABDOMEN LIMITED RUQ/ASCITES
1 series · 14 of 25 positions shown · non-contrast
Comparison: None.

CLINICAL DATA: Epigastric pain with nausea since last night.

EXAM:
ULTRASOUND ABDOMEN LIMITED RIGHT UPPER QUADRANT

[Series 1: us abdomen limited ruq/ascites · 14 of 37 slices shown]
[im 1/37]
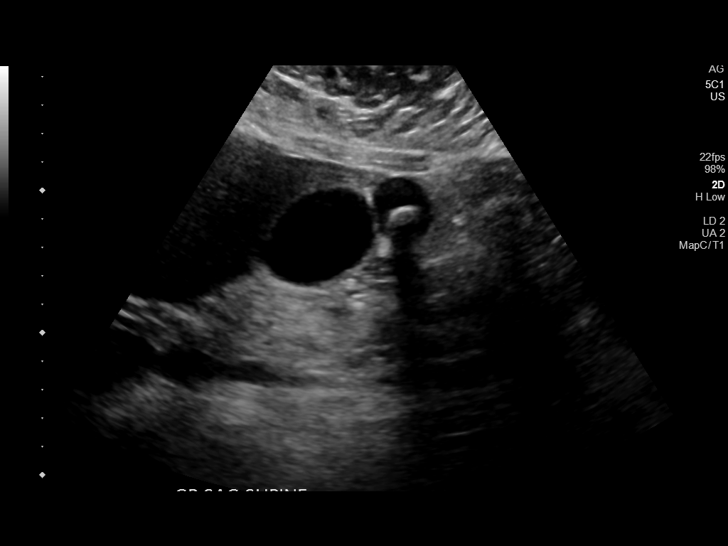
[im 4/37]
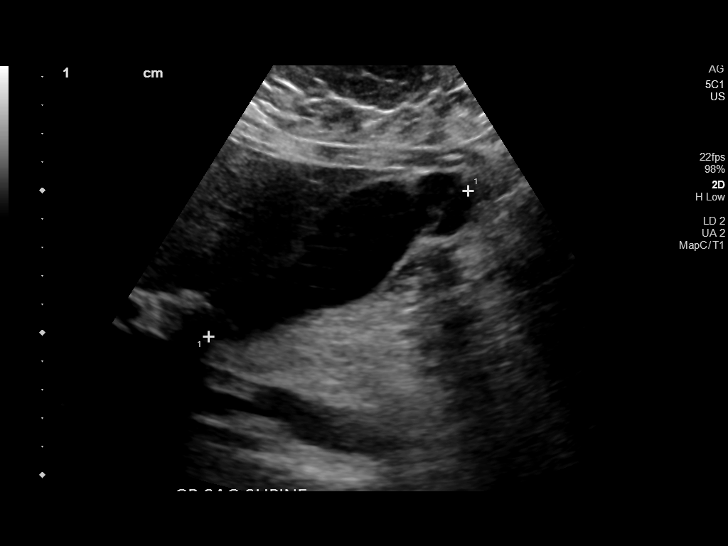
[im 7/37]
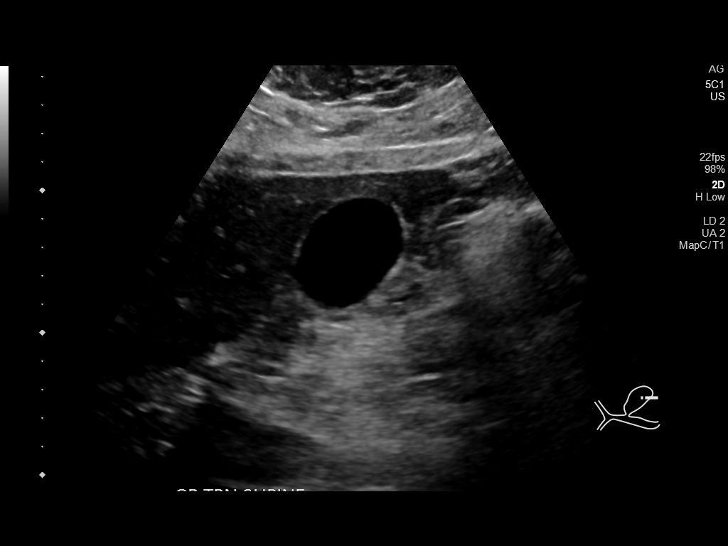
[im 10/37]
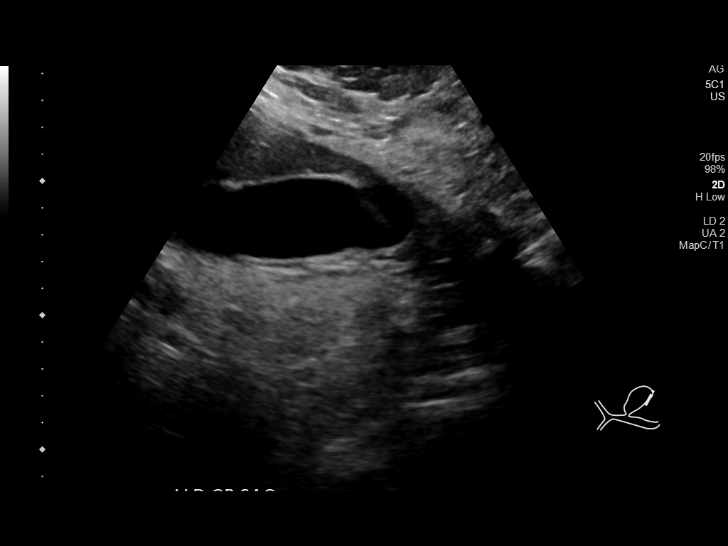
[im 13/37]
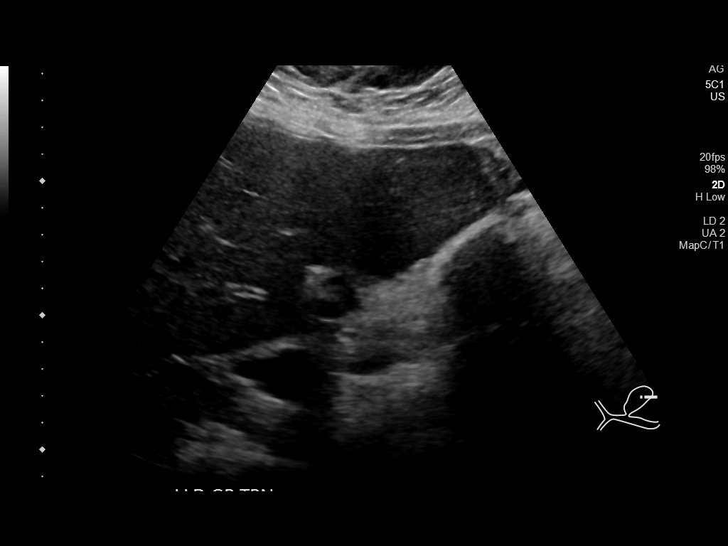
[im 14/37]
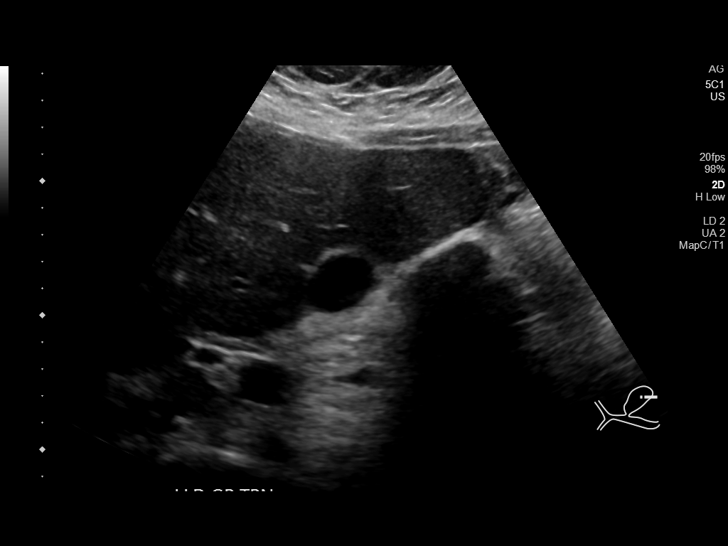
[im 17/37]
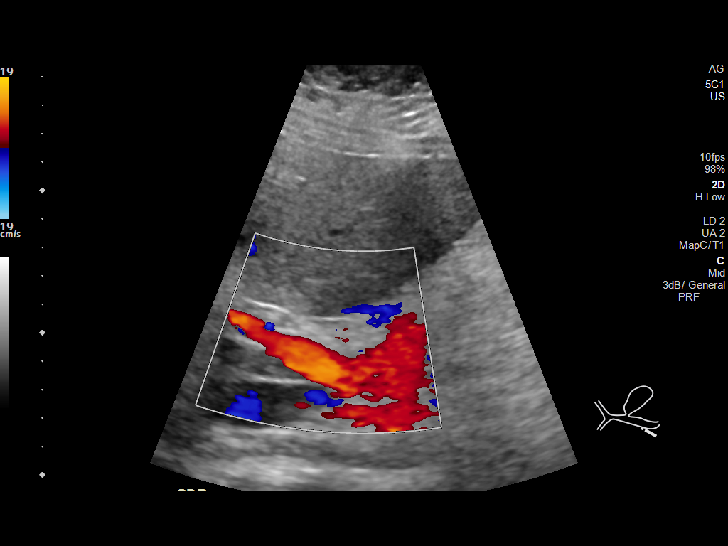
[im 20/37]
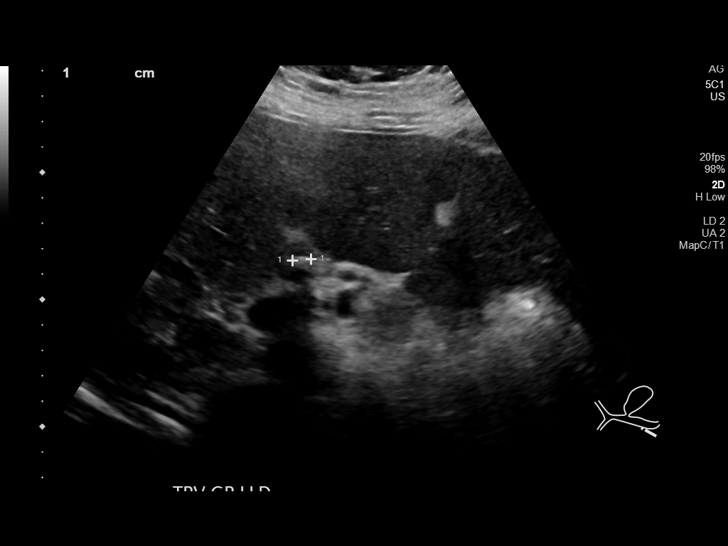
[im 23/37]
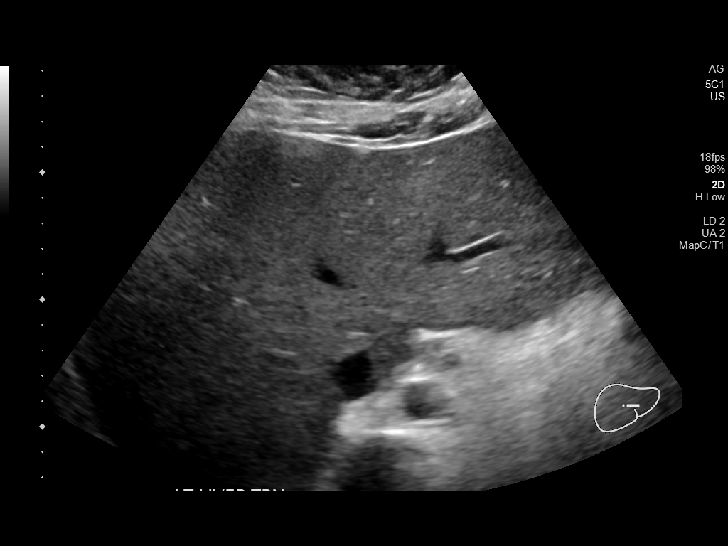
[im 25/37]
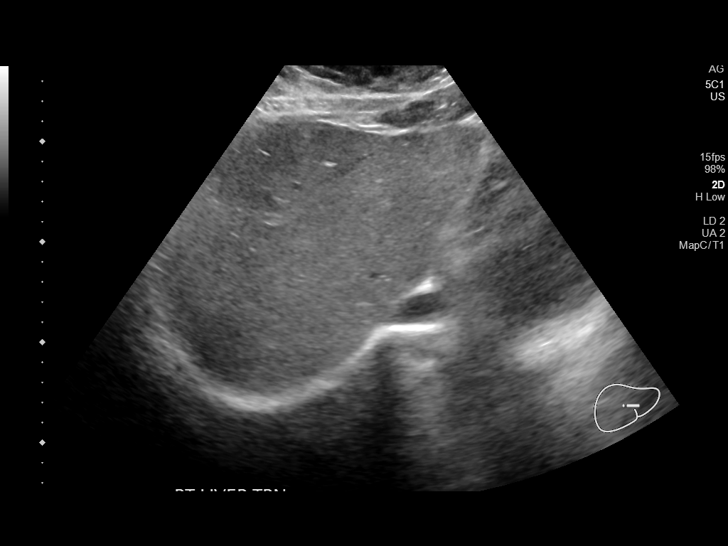
[im 28/37]
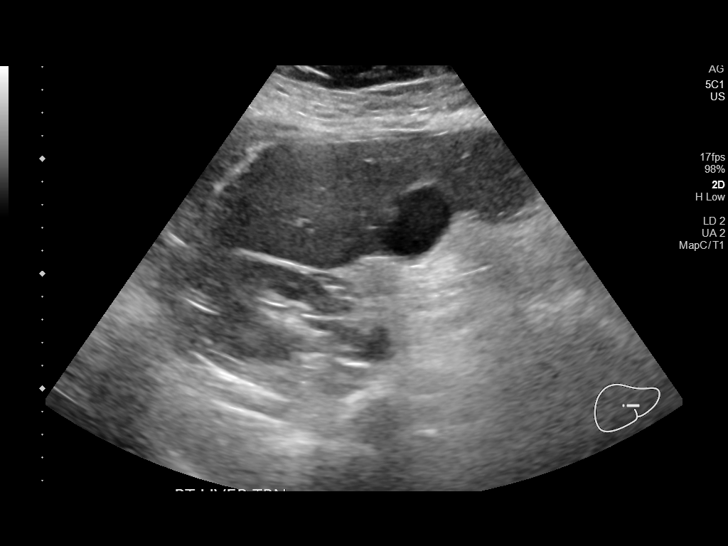
[im 31/37]
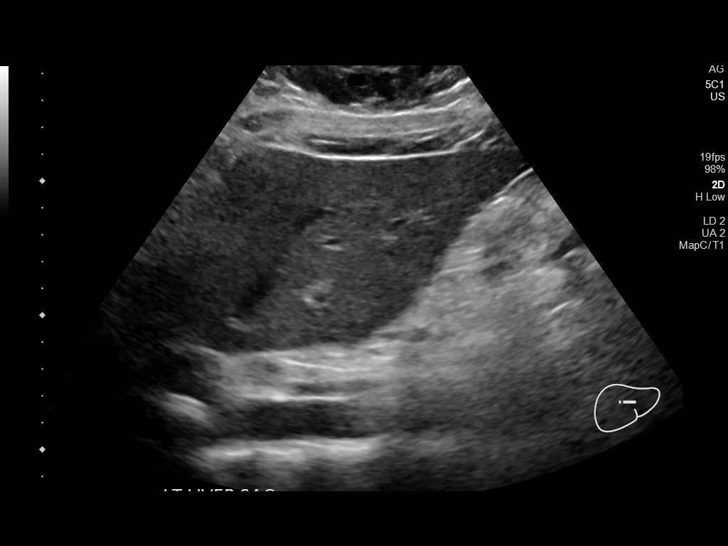
[im 34/37]
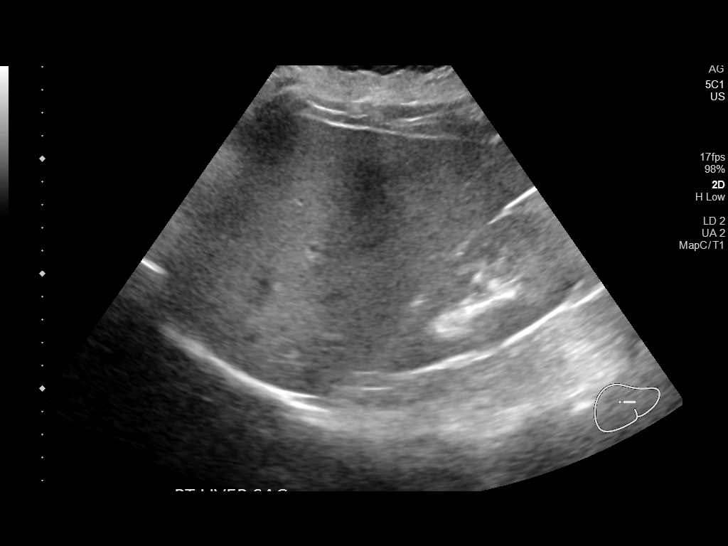
[im 37/37]
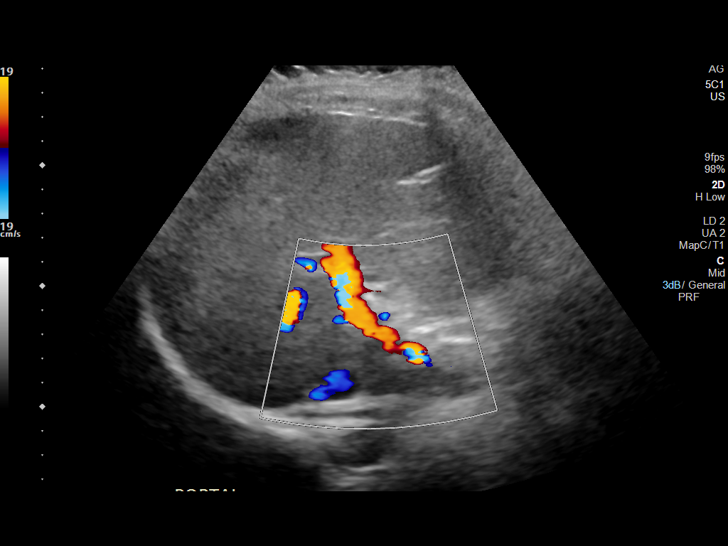

[14 of 25 positions shown; findings below may reference images not displayed]

FINDINGS: Gallbladder:

Mild prominence of the gallbladder without evidence of wall
thickening. There is cholelithiasis with largest stone measuring
cm. Negative sonographic Murphy sign. No adjacent free fluid.

Common bile duct:

Diameter: 3 mm.

Liver:

No focal lesion identified. Within normal limits in parenchymal
echogenicity. Portal vein is patent on color Doppler imaging with
normal direction of blood flow towards the liver.

Other: None.
IMPRESSION: Evidence of cholelithiasis without additional sonographic evidence
of cholecystitis.

## 2022-11-11 DIAGNOSIS — F4323 Adjustment disorder with mixed anxiety and depressed mood: Secondary | ICD-10-CM | POA: Diagnosis not present

## 2022-11-18 DIAGNOSIS — F4323 Adjustment disorder with mixed anxiety and depressed mood: Secondary | ICD-10-CM | POA: Diagnosis not present

## 2022-11-24 DIAGNOSIS — F4323 Adjustment disorder with mixed anxiety and depressed mood: Secondary | ICD-10-CM | POA: Diagnosis not present

## 2022-12-20 DIAGNOSIS — F4323 Adjustment disorder with mixed anxiety and depressed mood: Secondary | ICD-10-CM | POA: Diagnosis not present

## 2022-12-21 ENCOUNTER — Telehealth: Payer: Self-pay | Admitting: Pharmacy Technician

## 2022-12-21 ENCOUNTER — Other Ambulatory Visit (HOSPITAL_COMMUNITY): Payer: Self-pay

## 2022-12-21 NOTE — Telephone Encounter (Signed)
Clinical questions answers and PA submitted

## 2022-12-21 NOTE — Telephone Encounter (Signed)
Pharmacy Patient Advocate Encounter   Received notification from CoverMyMeds that prior authorization for Zepbound 5MG /0.5ML pen-injectors is required/requested.   Insurance verification completed.   The patient is insured through [facility] .   Per test claim: PA required; PA started via CoverMyMeds. KEY [id number] . Waiting for clinical questions to populate.

## 2022-12-23 ENCOUNTER — Other Ambulatory Visit (HOSPITAL_COMMUNITY): Payer: Self-pay

## 2022-12-23 NOTE — Telephone Encounter (Signed)
Pharmacy Patient Advocate Encounter  Received notification from [facility] that Prior Authorization for Zepbound 5mg /0.3ml has been APPROVED from 12/23/22 to 04/26/23   PA #/Case ID/Reference #: [id number]    Approval letter indexed to media tab

## 2022-12-27 DIAGNOSIS — F4323 Adjustment disorder with mixed anxiety and depressed mood: Secondary | ICD-10-CM | POA: Diagnosis not present

## 2023-01-12 DIAGNOSIS — F4323 Adjustment disorder with mixed anxiety and depressed mood: Secondary | ICD-10-CM | POA: Diagnosis not present

## 2023-01-16 ENCOUNTER — Encounter: Payer: Self-pay | Admitting: Nurse Practitioner

## 2023-01-16 ENCOUNTER — Ambulatory Visit: Payer: BC Managed Care – PPO | Admitting: Nurse Practitioner

## 2023-01-16 DIAGNOSIS — F411 Generalized anxiety disorder: Secondary | ICD-10-CM | POA: Diagnosis not present

## 2023-01-16 DIAGNOSIS — E049 Nontoxic goiter, unspecified: Secondary | ICD-10-CM

## 2023-01-16 DIAGNOSIS — E782 Mixed hyperlipidemia: Secondary | ICD-10-CM

## 2023-01-16 DIAGNOSIS — E559 Vitamin D deficiency, unspecified: Secondary | ICD-10-CM | POA: Diagnosis not present

## 2023-01-16 DIAGNOSIS — R7303 Prediabetes: Secondary | ICD-10-CM

## 2023-01-16 DIAGNOSIS — D75839 Thrombocytosis, unspecified: Secondary | ICD-10-CM | POA: Diagnosis not present

## 2023-01-16 DIAGNOSIS — F339 Major depressive disorder, recurrent, unspecified: Secondary | ICD-10-CM

## 2023-01-16 LAB — VITAMIN D 25 HYDROXY (VIT D DEFICIENCY, FRACTURES): VITD: 23.9 ng/mL — ABNORMAL LOW (ref 30.00–100.00)

## 2023-01-16 LAB — CBC WITH DIFFERENTIAL/PLATELET
Basophils Absolute: 0 10*3/uL (ref 0.0–0.1)
Basophils Relative: 0.4 % (ref 0.0–3.0)
Eosinophils Absolute: 0.1 10*3/uL (ref 0.0–0.7)
Eosinophils Relative: 1 % (ref 0.0–5.0)
HCT: 42.5 % (ref 36.0–46.0)
Hemoglobin: 13.9 g/dL (ref 12.0–15.0)
Lymphocytes Relative: 34.5 % (ref 12.0–46.0)
Lymphs Abs: 2.3 10*3/uL (ref 0.7–4.0)
MCHC: 32.7 g/dL (ref 30.0–36.0)
MCV: 90.4 fl (ref 78.0–100.0)
Monocytes Absolute: 0.7 10*3/uL (ref 0.1–1.0)
Monocytes Relative: 11.3 % (ref 3.0–12.0)
Neutro Abs: 3.5 10*3/uL (ref 1.4–7.7)
Neutrophils Relative %: 52.8 % (ref 43.0–77.0)
Platelets: 426 10*3/uL — ABNORMAL HIGH (ref 150.0–400.0)
RBC: 4.7 Mil/uL (ref 3.87–5.11)
RDW: 13 % (ref 11.5–15.5)
WBC: 6.6 10*3/uL (ref 4.0–10.5)

## 2023-01-16 LAB — LIPID PANEL
Cholesterol: 212 mg/dL — ABNORMAL HIGH (ref 0–200)
HDL: 43.7 mg/dL (ref >39.00)
LDL Cholesterol: 155 mg/dL — ABNORMAL HIGH (ref 0–99)
NonHDL: 168.33
Total CHOL/HDL Ratio: 5
Triglycerides: 65 mg/dL (ref 0.0–149.0)
VLDL: 13 mg/dL (ref 0.0–40.0)

## 2023-01-16 LAB — COMPREHENSIVE METABOLIC PANEL
ALT: 24 U/L (ref 0–35)
AST: 17 U/L (ref 0–37)
Albumin: 4.1 g/dL (ref 3.5–5.2)
Alkaline Phosphatase: 55 U/L (ref 39–117)
BUN: 9 mg/dL (ref 6–23)
CO2: 30 meq/L (ref 19–32)
Calcium: 9.3 mg/dL (ref 8.4–10.5)
Chloride: 101 meq/L (ref 96–112)
Creatinine, Ser: 0.86 mg/dL (ref 0.40–1.20)
GFR: 91.08 mL/min (ref >60.00)
Glucose, Bld: 77 mg/dL (ref 70–99)
Potassium: 5 meq/L (ref 3.5–5.1)
Sodium: 137 meq/L (ref 135–145)
Total Bilirubin: 0.3 mg/dL (ref 0.2–1.2)
Total Protein: 7.5 g/dL (ref 6.0–8.3)

## 2023-01-16 LAB — IBC + FERRITIN
Ferritin: 81.3 ng/mL (ref 10.0–291.0)
Iron: 70 ug/dL (ref 42–145)
Saturation Ratios: 20 % (ref 20.0–50.0)
TIBC: 350 ug/dL (ref 250.0–450.0)
Transferrin: 250 mg/dL (ref 212.0–360.0)

## 2023-01-16 LAB — TSH: TSH: 1.71 u[IU]/mL (ref 0.35–5.50)

## 2023-01-16 LAB — HEMOGLOBIN A1C: Hgb A1c MFr Bld: 5.5 % (ref 4.6–6.5)

## 2023-01-16 MED ORDER — DESVENLAFAXINE SUCCINATE ER 100 MG PO TB24
100.0000 mg | ORAL_TABLET | Freq: Every day | ORAL | 1 refills | Status: DC
Start: 2023-01-16 — End: 2023-04-04

## 2023-01-16 MED ORDER — TIRZEPATIDE-WEIGHT MANAGEMENT 2.5 MG/0.5ML ~~LOC~~ SOLN
2.5000 mg | SUBCUTANEOUS | 0 refills | Status: DC
Start: 2023-01-16 — End: 2023-04-04

## 2023-01-16 NOTE — Addendum Note (Signed)
Addended by: Alysia Penna L on: 01/16/2023 02:46 PM   Modules accepted: Orders

## 2023-01-16 NOTE — Assessment & Plan Note (Addendum)
Repeat hgbA1c: 5.5% normal

## 2023-01-16 NOTE — Assessment & Plan Note (Addendum)
Persistent fatigue Repeat vit. D: 23 maintain OVER THE COUNTER dose 2000IU daily

## 2023-01-16 NOTE — Assessment & Plan Note (Addendum)
No hoarseness or dysphagia or thyroimegaly Repeat THYROID: normal

## 2023-01-16 NOTE — Assessment & Plan Note (Addendum)
Worsening due to death of family member and father's poor health Has weekly counseling session with some improvement. Current use of effexor and trazodone with no adverse effects.  Switch effexor 150mg  XR to pristiq 100mg  XR. Maintain sessions with therapist F/up in 66month

## 2023-01-16 NOTE — Progress Notes (Addendum)
Established Patient Visit  Patient: Leslie Bell   DOB: 05/25/1990   32 y.o. Female  MRN: [id number] Visit Date: 01/16/2023  Subjective:    Chief Complaint  Patient presents with   Medication Management    Discuss getting abck on Zepbound    HPI Obesity, morbid (HCC) Zepbound discontinued 3months ago due to into maintaining f/up appointment. Also admits to lack of diet and exercise compliance. She had appointment with nutritionist last year but has not followed instructions provided. Gained 8lbs in last 5months. Wt Readings from Last 3 Encounters:  01/16/23 244 lb 12.8 oz (111 kg)  09/05/22 236 lb (107 kg)  06/13/22 237 lb (107.5 kg)  Has not been sexually active x 1year, LMP 11/07/2022  Repeat CMP, hgbA1c, THYROID, Vit. D: stable Start zepbound 2.5mg  F/up in 88month  Vitamin D deficiency Persistent fatigue Repeat vit. D: 23 maintain OVER THE COUNTER dose 2000IU daily  Goiter No hoarseness or dysphagia or thyroimegaly Repeat THYROID: normal  Depression, recurrent (HCC) Worsening due to death of family member and father's poor health Has weekly counseling session with some improvement. Current use of effexor and trazodone with no adverse effects.  Switch effexor 150mg  XR to pristiq 100mg  XR. Maintain sessions with therapist F/up in 88month  Prediabetes Repeat hgbA1c: 5.5% normal  Mixed hyperlipidemia Abnormal lipid panel: need to maintain a heart healthy diet and daily exercise.  BP Readings from Last 3 Encounters:  01/16/23 114/70  09/05/22 110/70  08/02/22 106/76     Reviewed medical, surgical, and social history today  Medications: Outpatient Medications Prior to Visit  Medication Sig   cetirizine (ZYRTEC ALLERGY) 10 MG tablet Take 1 tablet (10 mg total) by mouth daily.   Cholecalciferol (VITAMIN D3) 125 MCG (5000 UT) CAPS Take 1 capsule (5,000 Units total) by mouth daily.   ketoconazole (NIZORAL) 2 % shampoo Apply 1 Application  topically 2 (two) times a week.   tiZANidine (ZANAFLEX) 4 MG tablet Take 1 tablet (4 mg total) by mouth at bedtime.   traZODone (DESYREL) 50 MG tablet TAKE 1/2 TO 1 TABLET BY MOUTH AT BEDTIME AS NEEDED SLEEP   [DISCONTINUED] tirzepatide (ZEPBOUND) 5 MG/0.5ML Pen Inject 5 mg into the skin once a week.   [DISCONTINUED] venlafaxine XR (EFFEXOR-XR) 150 MG 24 hr capsule TAKE 1 CAPSULE BY MOUTH DAILY WITH BREAKFAST.   No facility-administered medications prior to visit.   Reviewed past medical and social history.   ROS per HPI above      Objective:  BP 114/70   Pulse 82   Temp 97.9 F (36.6 C) (Temporal)   Wt 244 lb 12.8 oz (111 kg)   SpO2 98%   BMI 45.51 kg/m      Physical Exam Cardiovascular:     Rate and Rhythm: Normal rate.     Pulses: Normal pulses.  Pulmonary:     Effort: Pulmonary effort is normal.  Neurological:     Mental Status: She is alert and oriented to person, place, and time.  Psychiatric:        Mood and Affect: Affect is flat.        Speech: Speech normal.        Behavior: Behavior is cooperative.        Thought Content: Thought content is not paranoid or delusional. Thought content includes suicidal ideation. Thought content does not include homicidal ideation. Thought content does not include homicidal or suicidal plan.  Cognition and Memory: Cognition and memory normal.        Judgment: Judgment normal.     Results for orders placed or performed in visit on 01/16/23  Hemoglobin A1c  Result Value Ref Range   Hgb A1c MFr Bld 5.5 4.6 - 6.5 %  Comprehensive metabolic panel  Result Value Ref Range   Sodium 137 135 - 145 mEq/L   Potassium 5.0 3.5 - 5.1 mEq/L   Chloride 101 96 - 112 mEq/L   CO2 30 19 - 32 mEq/L   Glucose, Bld 77 70 - 99 mg/dL   BUN 9 6 - 23 mg/dL   Creatinine, Ser 4.09 0.40 - 1.20 mg/dL   Total Bilirubin 0.3 0.2 - 1.2 mg/dL   Alkaline Phosphatase 55 39 - 117 U/L   AST 17 0 - 37 U/L   ALT 24 0 - 35 U/L   Total Protein 7.5 6.0 -  8.3 g/dL   Albumin 4.1 3.5 - 5.2 g/dL   GFR 81.19 >14.78 mL/min   Calcium 9.3 8.4 - 10.5 mg/dL  Lipid panel  Result Value Ref Range   Cholesterol 212 (H) 0 - 200 mg/dL   Triglycerides 29.5 0.0 - 149.0 mg/dL   HDL 62.13 >08.65 mg/dL   VLDL 78.4 0.0 - 69.6 mg/dL   LDL Cholesterol 295 (H) 0 - 99 mg/dL   Total CHOL/HDL Ratio 5    NonHDL 168.33   VITAMIN D 25 Hydroxy (Vit-D Deficiency, Fractures)  Result Value Ref Range   VITD 23.90 (L) 30.00 - 100.00 ng/mL  TSH  Result Value Ref Range   TSH 1.71 0.35 - 5.50 uIU/mL  IBC + Ferritin  Result Value Ref Range   Iron 70 42 - 145 ug/dL   Transferrin 284.1 324.4 - 360.0 mg/dL   Saturation Ratios 01.0 20.0 - 50.0 %   Ferritin 81.3 10.0 - 291.0 ng/mL   TIBC 350.0 250.0 - 450.0 mcg/dL  CBC with Differential/Platelet  Result Value Ref Range   WBC 6.6 4.0 - 10.5 K/uL   RBC 4.70 3.87 - 5.11 Mil/uL   Hemoglobin 13.9 12.0 - 15.0 g/dL   HCT 27.2 53.6 - 64.4 %   MCV 90.4 78.0 - 100.0 fl   MCHC 32.7 30.0 - 36.0 g/dL   RDW 03.4 74.2 - 59.5 %   Platelets 426.0 (H) 150.0 - 400.0 K/uL   Neutrophils Relative % 52.8 43.0 - 77.0 %   Lymphocytes Relative 34.5 12.0 - 46.0 %   Monocytes Relative 11.3 3.0 - 12.0 %   Eosinophils Relative 1.0 0.0 - 5.0 %   Basophils Relative 0.4 0.0 - 3.0 %   Neutro Abs 3.5 1.4 - 7.7 K/uL   Lymphs Abs 2.3 0.7 - 4.0 K/uL   Monocytes Absolute 0.7 0.1 - 1.0 K/uL   Eosinophils Absolute 0.1 0.0 - 0.7 K/uL   Basophils Absolute 0.0 0.0 - 0.1 K/uL      Assessment & Plan:    Problem List Items Addressed This Visit     Depression, recurrent (HCC)    Worsening due to death of family member and father's poor health Has weekly counseling session with some improvement. Current use of effexor and trazodone with no adverse effects.  Switch effexor 150mg  XR to pristiq 100mg  XR. Maintain sessions with therapist F/up in 18month      Relevant Medications   desvenlafaxine (PRISTIQ) 100 MG 24 hr tablet   GAD (generalized anxiety  disorder)   Relevant Medications   desvenlafaxine (PRISTIQ) 100 MG  24 hr tablet   Goiter    No hoarseness or dysphagia or thyroimegaly Repeat THYROID: normal      Relevant Orders   TSH (Completed)   Mixed hyperlipidemia    Abnormal lipid panel: need to maintain a heart healthy diet and daily exercise.      Relevant Orders   Lipid panel (Completed)   Obesity, morbid (HCC) - Primary    Zepbound discontinued 3months ago due to into maintaining f/up appointment. Also admits to lack of diet and exercise compliance. She had appointment with nutritionist last year but has not followed instructions provided. Gained 8lbs in last 5months. Wt Readings from Last 3 Encounters:  01/16/23 244 lb 12.8 oz (111 kg)  09/05/22 236 lb (107 kg)  06/13/22 237 lb (107.5 kg)  Has not been sexually active x 1year, LMP 11/07/2022  Repeat CMP, hgbA1c, THYROID, Vit. D: stable Start zepbound 2.5mg  F/up in 41month      Relevant Medications   Tirzepatide-Weight Management 2.5 MG/0.5ML SOLN   Other Relevant Orders   Comprehensive metabolic panel (Completed)   Prediabetes    Repeat hgbA1c: 5.5% normal      Relevant Orders   Hemoglobin A1c (Completed)   Vitamin D deficiency    Persistent fatigue Repeat vit. D: 23 maintain OVER THE COUNTER dose 2000IU daily      Relevant Orders   VITAMIN D 25 Hydroxy (Vit-D Deficiency, Fractures) (Completed)   Other Visit Diagnoses     Thrombocytosis       Relevant Orders   IBC + Ferritin (Completed)   CBC with Differential/Platelet (Completed)      Return in about 4 weeks (around 02/13/2023) for Weight management, depression and anxiety.     Alysia Penna, NP

## 2023-01-16 NOTE — Patient Instructions (Addendum)
Go to lab Start daily exercise, goal of per week. Maintain DASH diet. Will sen zepbound 2.5mg  after review of lab results  How to Increase Your Level of Physical Activity Getting regular physical activity is important for your overall health and well-being. Most people do not get enough exercise. There are easy ways to increase your level of physical activity, even if you have not been very active in the past or if you are just starting out. What are the benefits of physical activity? Physical activity has many short-term and long-term benefits. Being active on a regular basis can improve your physical and mental health as well as provide other benefits. Physical health benefits Helping you lose weight or maintain a healthy weight. Strengthening your muscles and bones. Reducing your risk of certain long-term (chronic) diseases, including heart disease, cancer, and diabetes. Being able to move around more easily and for longer periods of time without getting tired (increased endurance or stamina). Improving your ability to fight off illness (enhanced immunity). Being able to sleep better. Helping you stay healthy as you get older, including: Helping you stay mobile, or capable of walking and moving around. Preventing accidents, such as falls. Increasing life expectancy. Mental health benefits Boosting your mood and improving your self-esteem. Lowering your chance of having mental health problems, such as depression or anxiety. Helping you feel good about your body. Other benefits Finding new sources of fun and enjoyment. Meeting new people who share a common interest. Before you begin If you have a chronic illness or have not been active for a while, check with your health care provider about how to get started. Ask your health care provider what activities are safe for you. Start out slowly. Walking or doing some simple chair exercises is a good place to start, especially if you  have not been active before or for a long time. Set goals that you can work toward. Ask your health care provider how much exercise is best for you. In general, most adults should: Do moderate-intensity exercise for at least 150 minutes each week (30 minutes on most days of the week) or vigorous exercise for at least 75 minutes each week, or a combination of these. Moderate-intensity exercise can include walking at a quick pace, biking, yoga, water aerobics, or gardening. Vigorous exercise involves activities that take more effort, such as jogging or running, playing sports, swimming laps, or jumping rope. Do strength exercises on at least 2 days each week. This can include weight lifting, body weight exercises, and resistance-band exercises. How to be more physically active Make a plan  Try to find activities that you enjoy. You are more likely to commit to an exercise routine if it does not feel like a chore. If you have bone or joint problems, choose low-impact exercises, like walking or swimming. Use these tips for being successful with an exercise plan: Find a workout partner for accountability. Join a group or class, such as an aerobics class, cycling class, or sports team. Make family time active. Go for a walk, bike, or swim. Include a variety of exercises each week. Consider using a fitness tracker, such as a mobile phone app or a device worn like a watch, that will count the number of steps you take each day. Many people strive to reach 10,000 steps a day. Find ways to be active in your daily routines Besides your formal exercise plans, you can find ways to do physical activity during your daily routines, such as:  Walking or biking to work or to the store. Taking the stairs instead of the elevator. Parking farther away from the door at work or at the store. Planning walking meetings. Walking around while you are on the phone. Where to find more information Centers for Disease  Control and Prevention: [employer].pt President's Council on Fitness, Sports & Nutrition: www.fitness.gov ChooseMyPlate: http://www.harvey.com/ Contact a health care provider if: You have headaches, muscle aches, or joint pain that is concerning. You feel dizzy or light-headed while exercising. You faint. You feel your heart skipping, racing, or fluttering. You have chest pain while exercising. Summary Exercise benefits your mind and body at any age, even if you are just starting out. If you have a chronic illness or have not been active for a while, check with your health care provider before increasing your physical activity. Choose activities that are safe and enjoyable for you. Ask your health care provider what activities are safe for you. Start slowly. Tell your health care provider if you have problems as you start to increase your activity level. This information is not intended to replace advice given to you by your health care provider. Make sure you discuss any questions you have with your health care provider. Document Revised: 08/21/2020 Document Reviewed: 08/21/2020 Elsevier Patient Education  2024 [employer].

## 2023-01-16 NOTE — Assessment & Plan Note (Signed)
Abnormal lipid panel: need to maintain a heart healthy diet and daily exercise.

## 2023-01-16 NOTE — Assessment & Plan Note (Deleted)
Has weekly counseling session with some improvement. Need to consider resuming wellbutrin or effexor or SSRI F/up in 52month

## 2023-01-16 NOTE — Assessment & Plan Note (Addendum)
Zepbound discontinued 3months ago due to into maintaining f/up appointment. Also admits to lack of diet and exercise compliance. She had appointment with nutritionist last year but has not followed instructions provided. Gained 8lbs in last 5months. Wt Readings from Last 3 Encounters:  01/16/23 244 lb 12.8 oz (111 kg)  09/05/22 236 lb (107 kg)  06/13/22 237 lb (107.5 kg)  Has not been sexually active x 1year, LMP 11/07/2022  Repeat CMP, hgbA1c, THYROID, Vit. D: stable Start zepbound 2.5mg  F/up in 42month

## 2023-01-19 DIAGNOSIS — F4323 Adjustment disorder with mixed anxiety and depressed mood: Secondary | ICD-10-CM | POA: Diagnosis not present

## 2023-01-26 DIAGNOSIS — F4323 Adjustment disorder with mixed anxiety and depressed mood: Secondary | ICD-10-CM | POA: Diagnosis not present

## 2023-02-15 ENCOUNTER — Ambulatory Visit: Payer: BC Managed Care – PPO | Admitting: Nurse Practitioner

## 2023-02-21 DIAGNOSIS — F4323 Adjustment disorder with mixed anxiety and depressed mood: Secondary | ICD-10-CM | POA: Diagnosis not present

## 2023-03-02 DIAGNOSIS — F4323 Adjustment disorder with mixed anxiety and depressed mood: Secondary | ICD-10-CM | POA: Diagnosis not present

## 2023-03-13 ENCOUNTER — Ambulatory Visit: Payer: BC Managed Care – PPO | Admitting: Nurse Practitioner

## 2023-03-13 ENCOUNTER — Telehealth: Payer: Self-pay | Admitting: Nurse Practitioner

## 2023-03-13 NOTE — Telephone Encounter (Signed)
Pt was a no show for an OV with [facility] on 03/13/23, she did not call ahead.

## 2023-03-14 NOTE — Telephone Encounter (Signed)
03/23/2022 no show 03/13/2023 no show  Final warning sent via mail and mychart

## 2023-04-01 ENCOUNTER — Other Ambulatory Visit: Payer: Self-pay | Admitting: Nurse Practitioner

## 2023-04-01 DIAGNOSIS — F321 Major depressive disorder, single episode, moderate: Secondary | ICD-10-CM

## 2023-04-01 DIAGNOSIS — F411 Generalized anxiety disorder: Secondary | ICD-10-CM

## 2023-04-03 ENCOUNTER — Encounter: Payer: Self-pay | Admitting: Nurse Practitioner

## 2023-04-03 ENCOUNTER — Telehealth: Payer: Self-pay | Admitting: Nurse Practitioner

## 2023-04-03 DIAGNOSIS — F411 Generalized anxiety disorder: Secondary | ICD-10-CM

## 2023-04-03 DIAGNOSIS — F339 Major depressive disorder, recurrent, unspecified: Secondary | ICD-10-CM

## 2023-04-03 NOTE — Telephone Encounter (Signed)
Refill request  venlafaxine XR (EFFEXOR-XR) 150 MG 24 hr capsule [[id number]]  DISCONTINUED   Pt stated that she is still taking this med and only has 2 pills left and she goes through withdrawal without.   [id number] [address], [facility] [address], [place] [address] Phone: [phone]  Fax: [phone]

## 2023-04-04 MED ORDER — VENLAFAXINE HCL ER 150 MG PO CP24
150.0000 mg | ORAL_CAPSULE | Freq: Every day | ORAL | 1 refills | Status: DC
Start: 2023-04-04 — End: 2023-10-05

## 2023-04-04 MED ORDER — TIRZEPATIDE-WEIGHT MANAGEMENT 2.5 MG/0.5ML ~~LOC~~ SOLN
2.5000 mg | SUBCUTANEOUS | 0 refills | Status: DC
Start: 2023-04-04 — End: 2023-06-01

## 2023-04-04 NOTE — Telephone Encounter (Signed)
Informed patient RX was sent. PT verbalized understanding.

## 2023-04-04 NOTE — Addendum Note (Signed)
Addended by: Alysia Penna L on: 04/04/2023 01:02 PM   Modules accepted: Orders

## 2023-04-04 NOTE — Assessment & Plan Note (Signed)
She opted to maintain effexor instead of switching to pristiq

## 2023-04-14 ENCOUNTER — Other Ambulatory Visit (HOSPITAL_COMMUNITY): Payer: Self-pay

## 2023-05-04 ENCOUNTER — Other Ambulatory Visit: Payer: Self-pay | Admitting: Nurse Practitioner

## 2023-05-04 DIAGNOSIS — F411 Generalized anxiety disorder: Secondary | ICD-10-CM

## 2023-05-04 DIAGNOSIS — F321 Major depressive disorder, single episode, moderate: Secondary | ICD-10-CM

## 2023-05-05 NOTE — Telephone Encounter (Signed)
LR  09/08/22, #30, 5 rf LOV  01/16/23 FOV   05/16/23  Please review and advise.  Thanks. Dm/cma

## 2023-05-16 ENCOUNTER — Ambulatory Visit: Payer: BC Managed Care – PPO | Admitting: Nurse Practitioner

## 2023-06-01 ENCOUNTER — Encounter: Payer: Self-pay | Admitting: Nurse Practitioner

## 2023-06-01 ENCOUNTER — Ambulatory Visit: Payer: BC Managed Care – PPO | Admitting: Nurse Practitioner

## 2023-06-01 ENCOUNTER — Other Ambulatory Visit: Payer: Self-pay | Admitting: Nurse Practitioner

## 2023-06-01 VITALS — BP 111/67 | HR 78 | Temp 98.0°F | Resp 18 | Ht 61.5 in | Wt 240.8 lb

## 2023-06-01 DIAGNOSIS — Z6841 Body Mass Index (BMI) 40.0 and over, adult: Secondary | ICD-10-CM | POA: Diagnosis not present

## 2023-06-01 DIAGNOSIS — F339 Major depressive disorder, recurrent, unspecified: Secondary | ICD-10-CM

## 2023-06-01 DIAGNOSIS — F411 Generalized anxiety disorder: Secondary | ICD-10-CM | POA: Diagnosis not present

## 2023-06-01 DIAGNOSIS — E782 Mixed hyperlipidemia: Secondary | ICD-10-CM

## 2023-06-01 DIAGNOSIS — E282 Polycystic ovarian syndrome: Secondary | ICD-10-CM

## 2023-06-01 DIAGNOSIS — R7303 Prediabetes: Secondary | ICD-10-CM

## 2023-06-01 MED ORDER — TIRZEPATIDE-WEIGHT MANAGEMENT 5 MG/0.5ML ~~LOC~~ SOLN: 5.0000 mg | SUBCUTANEOUS | 0 refills | Status: DC

## 2023-06-01 NOTE — Assessment & Plan Note (Signed)
Stable mood with effexor

## 2023-06-01 NOTE — Assessment & Plan Note (Addendum)
Not consistent with zepbound dose due to inconsistent f/up appointment. Lost 4lbs in last 4months Exercise: not consistent Diet: low carb, low sugar, high fiber, and high protein Denies any adverse effects with zepbound Wt Readings from Last 3 Encounters:  06/01/23 240 lb 12.8 oz (109.2 kg)  01/16/23 244 lb 12.8 oz (111 kg)  09/05/22 236 lb (107 kg)    Advised about importance of maintaining f/up appts and taking med as prescribed. Advised to start daily exercise. Increase zepbound dose to 5mg  weekly F/up in 4weeks

## 2023-06-01 NOTE — Progress Notes (Signed)
Established Patient Visit  Patient: Leslie Bell   DOB: 1990/10/02   33 y.o. Female  MRN: [id number] Visit Date: 06/01/2023  Subjective:    Chief Complaint  Patient presents with   WEIGHT MANAGMENT     1 month follow up weight management, depression and anxiety    HPI Obesity, morbid (HCC) Not consistent with zepbound dose due to inconsistent f/up appointment. Lost 4lbs in last 4months Exercise: not consistent Diet: low carb, low sugar, high fiber, and high protein Denies any adverse effects with zepbound Wt Readings from Last 3 Encounters:  06/01/23 240 lb 12.8 oz (109.2 kg)  01/16/23 244 lb 12.8 oz (111 kg)  09/05/22 236 lb (107 kg)    Advised about importance of maintaining f/up appts and taking med as prescribed. Advised to start daily exercise. Increase zepbound dose to 5mg  weekly F/up in 4weeks  GAD (generalized anxiety disorder) Stable mood with effexor  Depression, recurrent (HCC) Stable mood with effexor     06/01/2023    8:51 AM 01/16/2023    9:55 AM 06/13/2022   12:00 PM  Depression screen PHQ 2/9  Decreased Interest 0 1 1  Down, Depressed, Hopeless 0 1 2  PHQ - 2 Score 0 2 3  Altered sleeping 0 1 3  Tired, decreased energy 1 3 1   Change in appetite 0 0 0  Feeling bad or failure about yourself  0 2 1  Trouble concentrating 1 2 1   Moving slowly or fidgety/restless 0 0 0  Suicidal thoughts 0 1 0  PHQ-9 Score 2 11 9   Difficult doing work/chores Not difficult at all Very difficult Somewhat difficult       06/01/2023    8:51 AM 01/16/2023    9:56 AM 06/13/2022   12:01 PM 03/28/2022   12:51 PM  GAD 7 : Generalized Anxiety Score  Nervous, Anxious, on Edge 0 1 1 3   Control/stop worrying 0 2 0 1  Worry too much - different things 0 2 0 2  Trouble relaxing 0 0 0 2  Restless 0 0 1 0  Easily annoyed or irritable 1 1 1 2   Afraid - awful might happen 0 0 0 0  Total GAD 7 Score 1 6 3 10   Anxiety Difficulty Not difficult at all Somewhat difficult  Somewhat difficult Very difficult     Reviewed medical, surgical, and social history today  Medications: Outpatient Medications Prior to Visit  Medication Sig   Cholecalciferol (VITAMIN D3) 125 MCG (5000 UT) CAPS Take 1 capsule (5,000 Units total) by mouth daily.   traZODone (DESYREL) 50 MG tablet TAKE 1/2 TO 1 TABLET BY MOUTH AT BEDTIME AS NEEDED SLEEP   venlafaxine XR (EFFEXOR XR) 150 MG 24 hr capsule Take 1 capsule (150 mg total) by mouth daily with breakfast.   [DISCONTINUED] medroxyPROGESTERone (PROVERA) 10 MG tablet Take 10 mg by mouth daily.   [DISCONTINUED] tirzepatide (ZEPBOUND) 2.5 MG/0.5ML injection vial Inject 2.5 mg into the skin once a week.   [DISCONTINUED] cetirizine (ZYRTEC ALLERGY) 10 MG tablet Take 1 tablet (10 mg total) by mouth daily. (Patient not taking: Reported on 06/01/2023)   [DISCONTINUED] ketoconazole (NIZORAL) 2 % shampoo Apply 1 Application topically 2 (two) times a week. (Patient not taking: Reported on 06/01/2023)   [DISCONTINUED] tiZANidine (ZANAFLEX) 4 MG tablet Take 1 tablet (4 mg total) by mouth at bedtime. (Patient not taking: Reported on 06/01/2023)   No facility-administered  medications prior to visit.   Reviewed past medical and social history.   ROS per HPI above      Objective:  BP 111/67 (BP Location: Left Arm, Patient Position: Sitting, Cuff Size: Large)   Pulse 78   Temp 98 F (36.7 C) (Temporal)   Resp 18   Ht 5' 1.5" (1.562 m)   Wt 240 lb 12.8 oz (109.2 kg)   LMP 04/15/2023 Comment: not sexually active  SpO2 99%   BMI 44.76 kg/m      Physical Exam Vitals and nursing note reviewed.  Constitutional:      Appearance: She is obese.  Cardiovascular:     Rate and Rhythm: Normal rate.     Pulses: Normal pulses.  Pulmonary:     Effort: Pulmonary effort is normal.  Neurological:     Mental Status: She is alert and oriented to person, place, and time.  Psychiatric:        Mood and Affect: Mood normal.        Behavior: Behavior  normal.        Thought Content: Thought content normal.     No results found for any visits on 06/01/23.    Assessment & Plan:    Problem List Items Addressed This Visit     Depression, recurrent (HCC)   Stable mood with effexor      GAD (generalized anxiety disorder) - Primary   Stable mood with effexor      Obesity, morbid (HCC)   Not consistent with zepbound dose due to inconsistent f/up appointment. Lost 4lbs in last 4months Exercise: not consistent Diet: low carb, low sugar, high fiber, and high protein Denies any adverse effects with zepbound Wt Readings from Last 3 Encounters:  06/01/23 240 lb 12.8 oz (109.2 kg)  01/16/23 244 lb 12.8 oz (111 kg)  09/05/22 236 lb (107 kg)    Advised about importance of maintaining f/up appts and taking med as prescribed. Advised to start daily exercise. Increase zepbound dose to 5mg  weekly F/up in 4weeks      Relevant Medications   tirzepatide 5 MG/0.5ML injection vial   Return in about 4 weeks (around 06/29/2023).     Alysia Penna, NP

## 2023-06-01 NOTE — Patient Instructions (Signed)
Go to lab Maintain Heart healthy diet and daily exercise. Maintain current medications.  How to Increase Your Level of Physical Activity Getting regular physical activity is important for your overall health and well-being. Most people do not get enough exercise. There are easy ways to increase your level of physical activity, even if you have not been very active in the past or if you are just starting out. What are the benefits of physical activity? Physical activity has many short-term and long-term benefits. Being active on a regular basis can improve your physical and mental health as well as provide other benefits. Physical health benefits Helping you lose weight or maintain a healthy weight. Strengthening your muscles and bones. Reducing your risk of certain long-term (chronic) diseases, including heart disease, cancer, and diabetes. Being able to move around more easily and for longer periods of time without getting tired (increased endurance or stamina). Improving your ability to fight off illness (enhanced immunity). Being able to sleep better. Helping you stay healthy as you get older, including: Helping you stay mobile, or capable of walking and moving around. Preventing accidents, such as falls. Increasing life expectancy. Mental health benefits Boosting your mood and improving your self-esteem. Lowering your chance of having mental health problems, such as depression or anxiety. Helping you feel good about your body. Other benefits Finding new sources of fun and enjoyment. Meeting new people who share a common interest. Before you begin If you have a chronic illness or have not been active for a while, check with your health care provider about how to get started. Ask your health care provider what activities are safe for you. Start out slowly. Walking or doing some simple chair exercises is a good place to start, especially if you have not been active before or for a long  time. Set goals that you can work toward. Ask your health care provider how much exercise is best for you. In general, most adults should: Do moderate-intensity exercise for at least 150 minutes each week (30 minutes on most days of the week) or vigorous exercise for at least 75 minutes each week, or a combination of these. Moderate-intensity exercise can include walking at a quick pace, biking, yoga, water aerobics, or gardening. Vigorous exercise involves activities that take more effort, such as jogging or running, playing sports, swimming laps, or jumping rope. Do strength exercises on at least 2 days each week. This can include weight lifting, body weight exercises, and resistance-band exercises. How to be more physically active Make a plan  Try to find activities that you enjoy. You are more likely to commit to an exercise routine if it does not feel like a chore. If you have bone or joint problems, choose low-impact exercises, like walking or swimming. Use these tips for being successful with an exercise plan: Find a workout partner for accountability. Join a group or class, such as an aerobics class, cycling class, or sports team. Make family time active. Go for a walk, bike, or swim. Include a variety of exercises each week. Consider using a fitness tracker, such as a mobile phone app or a device worn like a watch, that will count the number of steps you take each day. Many people strive to reach 10,000 steps a day. Find ways to be active in your daily routines Besides your formal exercise plans, you can find ways to do physical activity during your daily routines, such as: Walking or biking to work or to the store. Taking  the stairs instead of the elevator. Parking farther away from the door at work or at the store. Planning walking meetings. Walking around while you are on the phone. Where to find more information Centers for Disease Control and Prevention:  [employer] Fitness, Sports & Nutrition: www.fitness.gov ChooseMyPlate: http://www.harvey.com/ Contact a health care provider if: You have headaches, muscle aches, or joint pain that is concerning. You feel dizzy or light-headed while exercising. You faint. You feel your heart skipping, racing, or fluttering. You have chest pain while exercising. Summary Exercise benefits your mind and body at any age, even if you are just starting out. If you have a chronic illness or have not been active for a while, check with your health care provider before increasing your physical activity. Choose activities that are safe and enjoyable for you. Ask your health care provider what activities are safe for you. Start slowly. Tell your health care provider if you have problems as you start to increase your activity level. This information is not intended to replace advice given to you by your health care provider. Make sure you discuss any questions you have with your health care provider. Document Revised: 08/21/2020 Document Reviewed: 08/21/2020 Elsevier Patient Education  2024 [employer].

## 2023-06-06 MED ORDER — METFORMIN HCL ER 500 MG PO TB24: 500.0000 mg | ORAL_TABLET | Freq: Every day | ORAL | 5 refills | Status: DC

## 2023-06-08 ENCOUNTER — Ambulatory Visit: Payer: BC Managed Care – PPO | Admitting: Nurse Practitioner

## 2023-06-13 ENCOUNTER — Other Ambulatory Visit: Payer: Self-pay | Admitting: Nurse Practitioner

## 2023-06-13 ENCOUNTER — Other Ambulatory Visit (HOSPITAL_COMMUNITY)
Admission: RE | Admit: 2023-06-13 | Discharge: 2023-06-13 | Disposition: A | Discharge status: Home / Self Care | Payer: Self-pay | Source: Ambulatory Visit | Attending: Nurse Practitioner | Admitting: Nurse Practitioner

## 2023-06-13 DIAGNOSIS — Z124 Encounter for screening for malignant neoplasm of cervix: Secondary | ICD-10-CM | POA: Insufficient documentation

## 2023-06-13 DIAGNOSIS — Z01419 Encounter for gynecological examination (general) (routine) without abnormal findings: Secondary | ICD-10-CM | POA: Diagnosis not present

## 2023-06-13 DIAGNOSIS — Z113 Encounter for screening for infections with a predominantly sexual mode of transmission: Secondary | ICD-10-CM | POA: Diagnosis not present

## 2023-06-13 DIAGNOSIS — E282 Polycystic ovarian syndrome: Secondary | ICD-10-CM | POA: Diagnosis not present

## 2023-06-19 LAB — CYTOLOGY - PAP
Comment: NEGATIVE
Comment: NEGATIVE
Comment: NEGATIVE
HPV 16: NEGATIVE
HPV 18 / 45: NEGATIVE
High risk HPV: POSITIVE — AB

## 2023-06-22 DIAGNOSIS — F4323 Adjustment disorder with mixed anxiety and depressed mood: Secondary | ICD-10-CM | POA: Diagnosis not present

## 2023-06-27 DIAGNOSIS — F4323 Adjustment disorder with mixed anxiety and depressed mood: Secondary | ICD-10-CM | POA: Diagnosis not present

## 2023-06-28 ENCOUNTER — Other Ambulatory Visit: Payer: Self-pay

## 2023-06-28 DIAGNOSIS — Z3202 Encounter for pregnancy test, result negative: Secondary | ICD-10-CM | POA: Diagnosis not present

## 2023-06-28 DIAGNOSIS — N72 Inflammatory disease of cervix uteri: Secondary | ICD-10-CM | POA: Diagnosis not present

## 2023-06-28 DIAGNOSIS — R87810 Cervical high risk human papillomavirus (HPV) DNA test positive: Secondary | ICD-10-CM | POA: Diagnosis not present

## 2023-06-28 DIAGNOSIS — R87612 Low grade squamous intraepithelial lesion on cytologic smear of cervix (LGSIL): Secondary | ICD-10-CM | POA: Diagnosis not present

## 2023-06-28 DIAGNOSIS — N879 Dysplasia of cervix uteri, unspecified: Secondary | ICD-10-CM | POA: Diagnosis not present

## 2023-06-30 LAB — SURGICAL PATHOLOGY

## 2023-07-12 ENCOUNTER — Encounter (INDEPENDENT_AMBULATORY_CARE_PROVIDER_SITE_OTHER): Payer: Self-pay

## 2023-07-28 DIAGNOSIS — F4323 Adjustment disorder with mixed anxiety and depressed mood: Secondary | ICD-10-CM | POA: Diagnosis not present

## 2023-07-31 DIAGNOSIS — E559 Vitamin D deficiency, unspecified: Secondary | ICD-10-CM | POA: Diagnosis not present

## 2023-07-31 DIAGNOSIS — R5383 Other fatigue: Secondary | ICD-10-CM | POA: Diagnosis not present

## 2023-07-31 DIAGNOSIS — E282 Polycystic ovarian syndrome: Secondary | ICD-10-CM | POA: Diagnosis not present

## 2023-07-31 LAB — LIPID PANEL
Cholesterol: 186 (ref 0–200)
HDL: 49 (ref 35–70)
LDL Cholesterol: 118
Triglycerides: 105 (ref 40–160)

## 2023-07-31 LAB — HEPATIC FUNCTION PANEL
ALT: 12 U/L (ref 7–35)
AST: 11 — AB (ref 13–35)
Alkaline Phosphatase: 66 (ref 25–125)
Bilirubin, Total: 3.1

## 2023-07-31 LAB — CBC AND DIFFERENTIAL
HCT: 39 (ref 36–46)
Hemoglobin: 12.4 (ref 12.0–16.0)
Neutrophils Absolute: 5
Platelets: 409 K/uL — AB (ref 150–400)
WBC: 8.3

## 2023-07-31 LAB — BASIC METABOLIC PANEL WITH GFR
BUN: 7 (ref 4–21)
CO2: 22 (ref 13–22)
Chloride: 104 (ref 99–108)
Creatinine: 0.7 (ref 0.5–1.1)
Glucose: 103
Potassium: 4.4 meq/L (ref 3.5–5.1)
Sodium: 139 (ref 137–147)

## 2023-07-31 LAB — VITAMIN D 25 HYDROXY (VIT D DEFICIENCY, FRACTURES): Vit D, 25-Hydroxy: 37.1

## 2023-07-31 LAB — CBC: RBC: 4.27 (ref 3.87–5.11)

## 2023-07-31 LAB — HEMOGLOBIN A1C: Hemoglobin A1C: 5.5

## 2023-07-31 LAB — TSH: TSH: 1.81 (ref 0.41–5.90)

## 2023-08-05 LAB — COMPREHENSIVE METABOLIC PANEL WITH GFR
Albumin: 4 (ref 3.5–5.0)
Calcium: 9.3 (ref 8.7–10.7)
Globulin: 3.1
eGFR: 119

## 2023-08-10 DIAGNOSIS — F4323 Adjustment disorder with mixed anxiety and depressed mood: Secondary | ICD-10-CM | POA: Diagnosis not present

## 2023-08-23 ENCOUNTER — Other Ambulatory Visit: Payer: Self-pay | Admitting: Nurse Practitioner

## 2023-08-23 DIAGNOSIS — F411 Generalized anxiety disorder: Secondary | ICD-10-CM

## 2023-08-23 DIAGNOSIS — F321 Major depressive disorder, single episode, moderate: Secondary | ICD-10-CM

## 2023-08-31 DIAGNOSIS — F4323 Adjustment disorder with mixed anxiety and depressed mood: Secondary | ICD-10-CM | POA: Diagnosis not present

## 2023-10-05 ENCOUNTER — Encounter: Payer: Self-pay | Admitting: Nurse Practitioner

## 2023-10-05 DIAGNOSIS — F411 Generalized anxiety disorder: Secondary | ICD-10-CM

## 2023-10-05 DIAGNOSIS — F339 Major depressive disorder, recurrent, unspecified: Secondary | ICD-10-CM

## 2023-10-05 DIAGNOSIS — F4323 Adjustment disorder with mixed anxiety and depressed mood: Secondary | ICD-10-CM | POA: Diagnosis not present

## 2023-10-05 MED ORDER — VENLAFAXINE HCL ER 150 MG PO CP24: 150.0000 mg | ORAL_CAPSULE | Freq: Every day | ORAL | 0 refills | Status: DC

## 2023-10-12 IMAGING — DX DG ABDOMEN 1V
3 series · 3 of 3 positions shown · non-contrast
Comparison: None.

CLINICAL DATA: Abdominal pain, bloating, and diarrhea for 4 days.

EXAM:
ABDOMEN - 1 VIEW

[abdomen supine ap (1 of 3)]
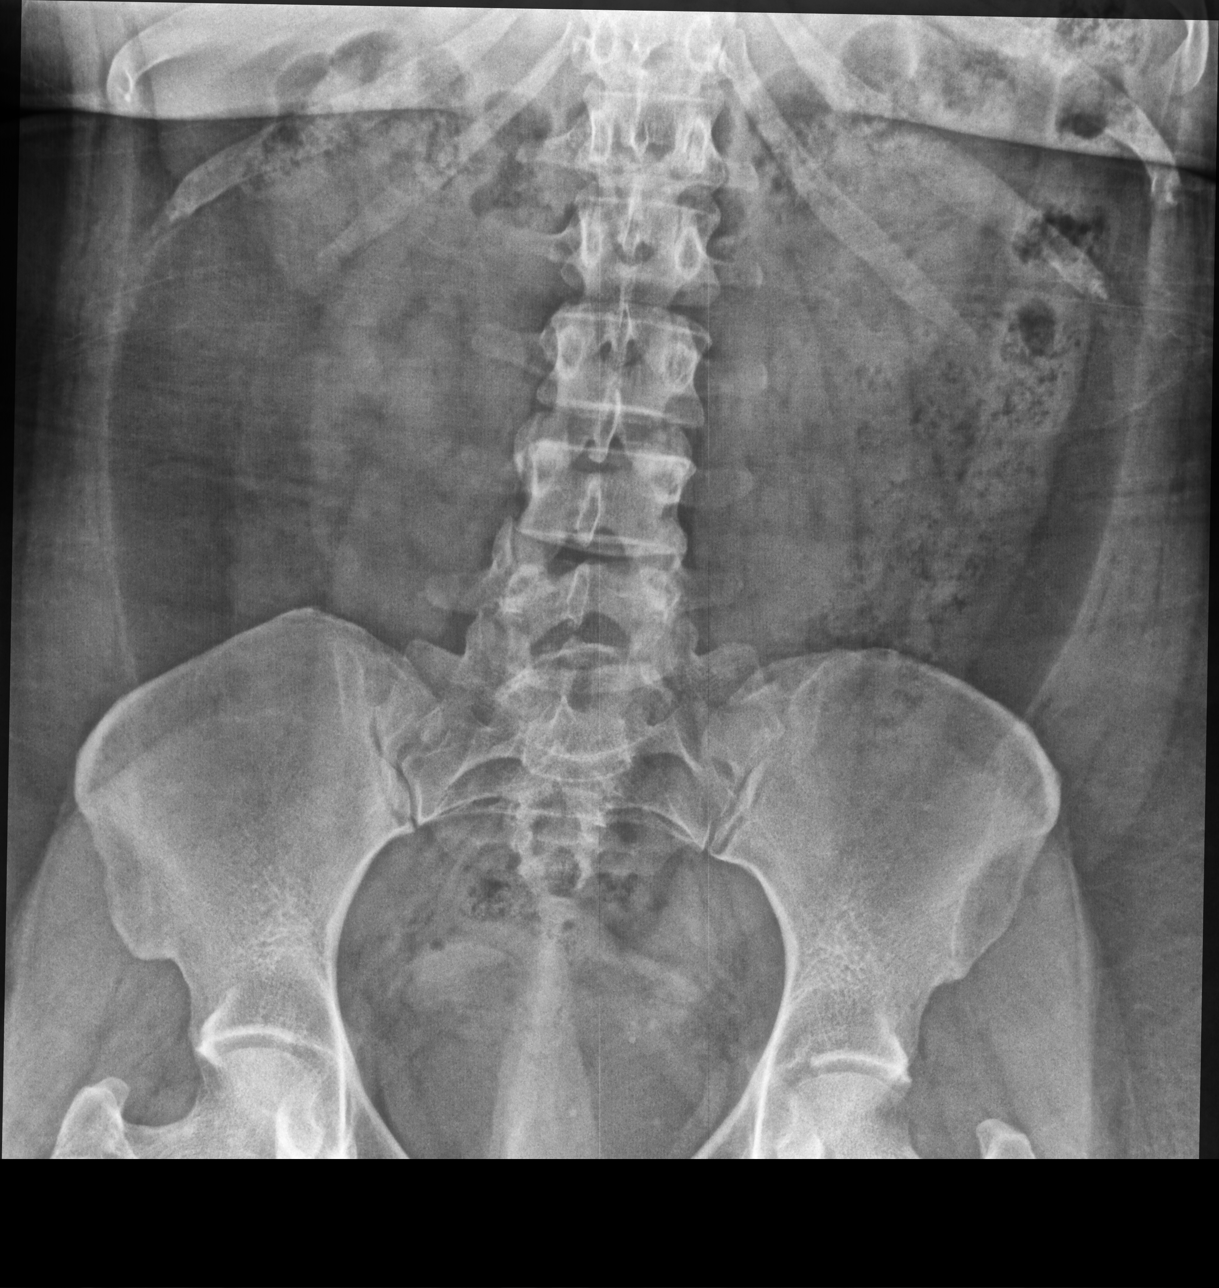

[abdomen supine ap (2 of 3)]
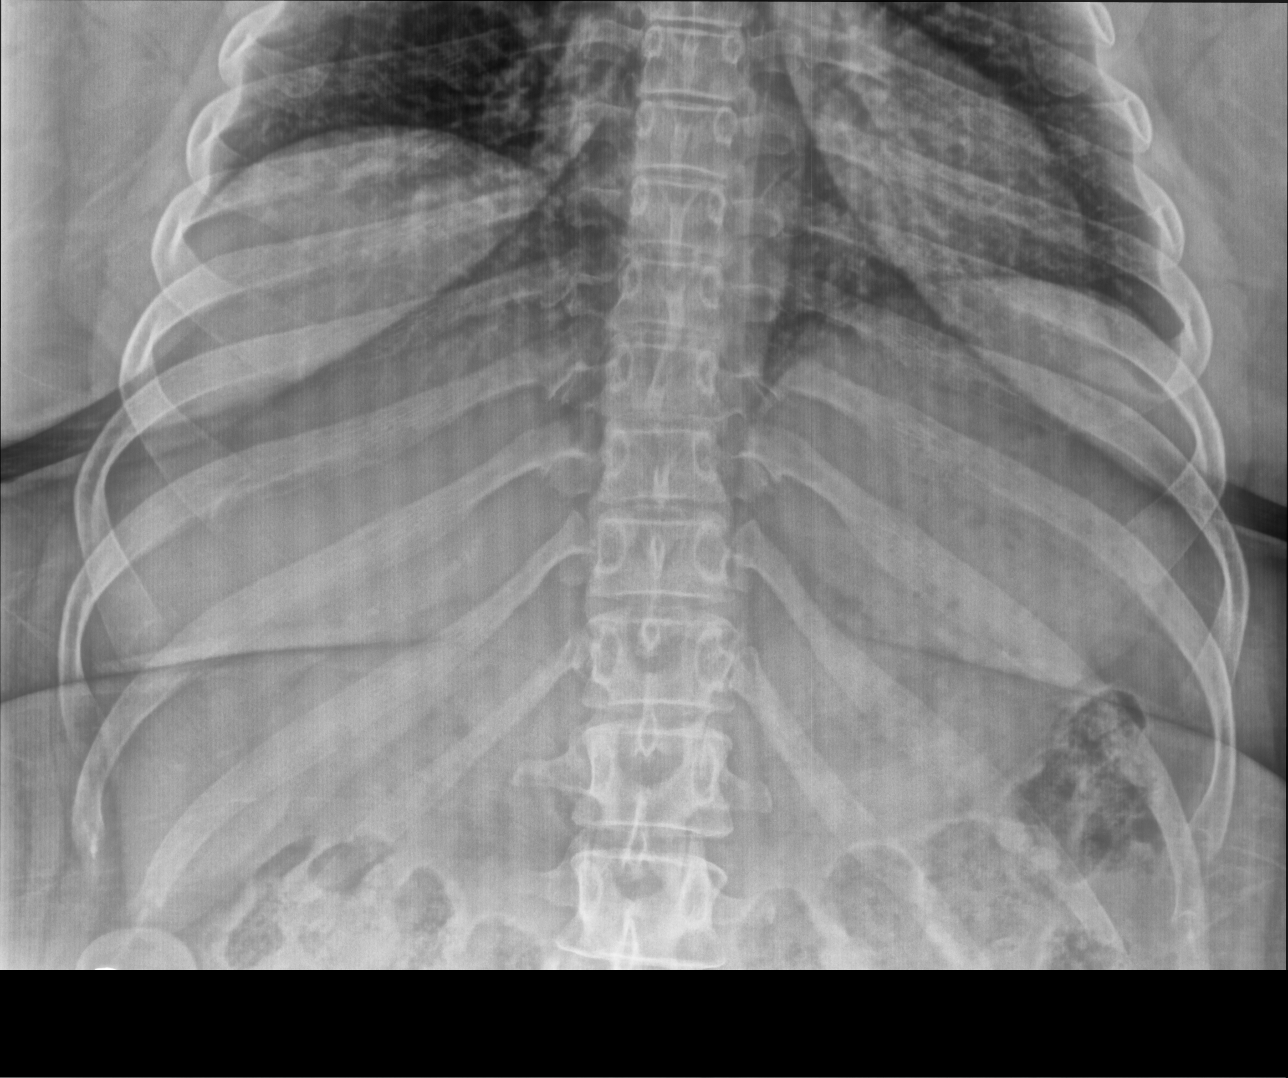

[abdomen supine ap (3 of 3)]
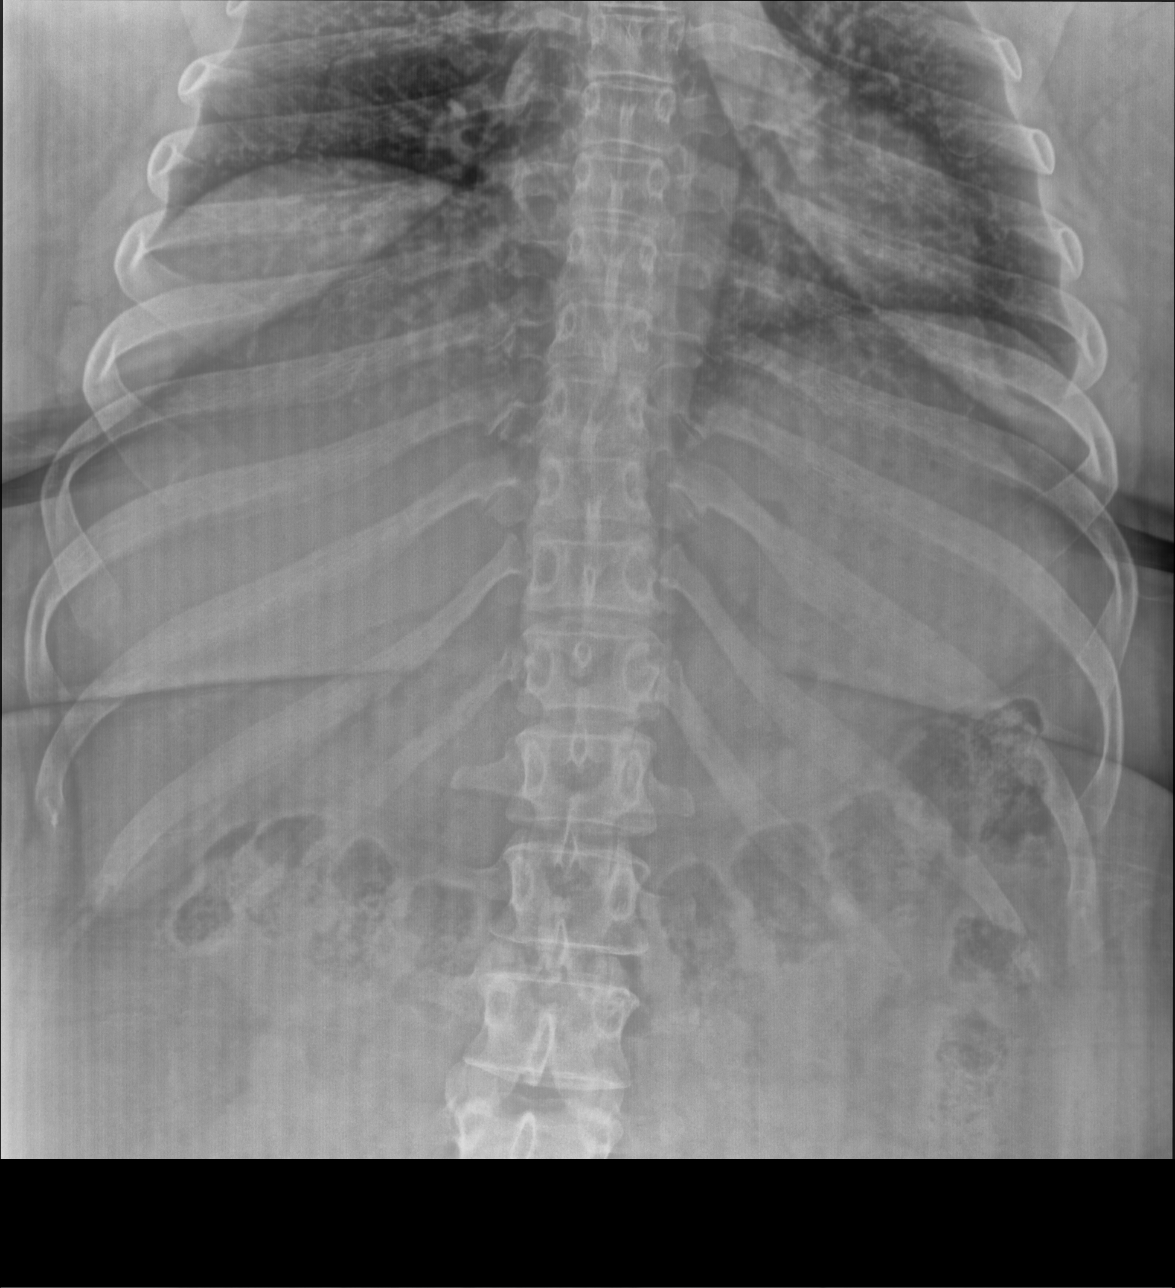

[3 of 3 positions shown; findings below may reference images not displayed]

FINDINGS: The bowel gas pattern is normal. No radio-opaque calculi or other
significant radiographic abnormality are seen.
IMPRESSION: Negative.

## 2023-12-26 ENCOUNTER — Telehealth: Payer: Self-pay

## 2023-12-26 NOTE — Telephone Encounter (Signed)
 Called to ask pt if she was ok with moving Tues 01/02/24 appt from 11:00 to 11:20, arriving prior for check. Pt agreeable. Appt changed.

## 2024-01-02 ENCOUNTER — Ambulatory Visit: Admitting: Nurse Practitioner

## 2024-01-04 ENCOUNTER — Other Ambulatory Visit: Payer: Self-pay | Admitting: Nurse Practitioner

## 2024-01-04 DIAGNOSIS — F339 Major depressive disorder, recurrent, unspecified: Secondary | ICD-10-CM

## 2024-01-04 DIAGNOSIS — F411 Generalized anxiety disorder: Secondary | ICD-10-CM

## 2024-01-09 ENCOUNTER — Encounter: Payer: Self-pay | Admitting: Nurse Practitioner

## 2024-01-09 ENCOUNTER — Ambulatory Visit: Admitting: Nurse Practitioner

## 2024-01-09 VITALS — BP 104/78 | HR 85 | Temp 97.9°F | Ht 61.5 in | Wt 231.2 lb

## 2024-01-09 DIAGNOSIS — E782 Mixed hyperlipidemia: Secondary | ICD-10-CM | POA: Diagnosis not present

## 2024-01-09 DIAGNOSIS — F411 Generalized anxiety disorder: Secondary | ICD-10-CM | POA: Diagnosis not present

## 2024-01-09 DIAGNOSIS — E559 Vitamin D deficiency, unspecified: Secondary | ICD-10-CM

## 2024-01-09 DIAGNOSIS — R400 Somnolence: Secondary | ICD-10-CM | POA: Diagnosis not present

## 2024-01-09 DIAGNOSIS — F339 Major depressive disorder, recurrent, unspecified: Secondary | ICD-10-CM

## 2024-01-09 MED ORDER — VENLAFAXINE HCL ER 150 MG PO CP24: 150.0000 mg | ORAL_CAPSULE | Freq: Every day | ORAL | 3 refills | Status: AC

## 2024-01-09 NOTE — Assessment & Plan Note (Signed)
 Stable mood with effexor  Denies need for trazodone 

## 2024-01-09 NOTE — Patient Instructions (Signed)
 Send copy of recent lab results

## 2024-01-09 NOTE — Assessment & Plan Note (Signed)
 Under the care of [facility]-sees a provider and nutritionist every 6weeks Zepbound  prescribed Wt Readings from Last 3 Encounters:  01/09/24 231 lb 3.2 oz (104.9 kg)  06/01/23 240 lb 12.8 oz (109.2 kg)  01/16/23 244 lb 12.8 oz (111 kg)

## 2024-01-09 NOTE — Assessment & Plan Note (Signed)
 Improved to 37 per labs completed in 07/2023 and [facility]. Advised to continue OVER THE COUNTER dose

## 2024-01-09 NOTE — Assessment & Plan Note (Signed)
 Improved per labs completed 07/2023 by [facility]: TC 187, LDL 118. Encouraged to continue heart healthy diet and daily exercise Current use of zepbound 

## 2024-01-09 NOTE — Progress Notes (Signed)
 Established Patient Visit  Patient: Leslie Bell   DOB: 06-05-1990   32 y.o. Female  MRN: [id number] Visit Date: 01/09/2024  Subjective:    Chief Complaint  Patient presents with   Medical Management of Chronic Issues    Medication refill of Effexor , No new complaints. Started Zepbound  in March with new provider for PCOS.   HPI Vitamin D  deficiency Improved to 37 per labs completed in 07/2023 and [facility]. Advised to continue OVER THE COUNTER dose  Uncontrolled daytime somnolence Improved sleep initiation with magnesium glycinate 400mg  at hs, but persistent fatigue, restless sleep and daytime somnolence. Need for nap after large meal at lunch time or long car rides. Easily falls asleep if sitting at a stoplight or reading a book or watching TV or in a middle of a conversation easily if she has not had a nap that day. Need for mid day nap 1-2hrs Neck circumference 15.8inch Waist circumference 49.6inch No hx of HYPERTENSION Wt Readings from Last 3 Encounters:  01/09/24 231 lb 3.2 oz (104.9 kg)  06/01/23 240 lb 12.8 oz (109.2 kg)  01/16/23 244 lb 12.8 oz (111 kg)    BP Readings from Last 3 Encounters:  01/09/24 104/78  06/01/23 111/67  01/16/23 114/70    Entered referral to sleep specialist  Mixed hyperlipidemia Improved per labs completed 07/2023 by [facility]: TC 187, LDL 118. Encouraged to continue heart healthy diet and daily exercise Current use of zepbound   Obesity, morbid (HCC) Under the care of [facility]-sees a provider and nutritionist every 6weeks Zepbound  prescribed Wt Readings from Last 3 Encounters:  01/09/24 231 lb 3.2 oz (104.9 kg)  06/01/23 240 lb 12.8 oz (109.2 kg)  01/16/23 244 lb 12.8 oz (111 kg)     GAD (generalized anxiety disorder) Stable mood with effexor  Denies need for trazodone   Depression, recurrent (HCC) Stable mood with effexor  Denies need for trazodone      01/09/2024   11:30 AM  06/01/2023    8:51 AM 01/16/2023    9:55 AM  Depression screen PHQ 2/9  Decreased Interest 1 0 1  Down, Depressed, Hopeless 1 0 1  PHQ - 2 Score 2 0 2  Altered sleeping 3 0 1  Tired, decreased energy 3 1 3   Change in appetite 0 0 0  Feeling bad or failure about yourself  1 0 2  Trouble concentrating 1 1 2   Moving slowly or fidgety/restless 0 0 0  Suicidal thoughts 0 0 1  PHQ-9 Score 10 2 11   Difficult doing work/chores Very difficult Not difficult at all Very difficult       01/09/2024   11:32 AM 06/01/2023    8:51 AM 01/16/2023    9:56 AM 06/13/2022   12:01 PM  GAD 7 : Generalized Anxiety Score  Nervous, Anxious, on Edge 0 0 1 1  Control/stop worrying 0 0 2 0  Worry too much - different things 0 0 2 0  Trouble relaxing 0 0 0 0  Restless 0 0 0 1  Easily annoyed or irritable 0 1 1 1   Afraid - awful might happen 0 0 0 0  Total GAD 7 Score 0 1 6 3   Anxiety Difficulty Not difficult at all Not difficult at all Somewhat difficult Somewhat difficult    Reviewed medical, surgical, and social history today  Medications: Outpatient Medications Prior to Visit  Medication Sig   Cholecalciferol (  VITAMIN D3) 125 MCG (5000 UT) CAPS Take 1 capsule (5,000 Units total) by mouth daily.   Magnesium Glycinate 100 MG CAPS Take 400 mg by mouth at bedtime.   metFORMIN  (GLUCOPHAGE -XR) 500 MG 24 hr tablet Take 1 tablet (500 mg total) by mouth daily with breakfast.   norgestimate-ethinyl estradiol (ORTHO-CYCLEN) 0.25-35 MG-MCG tablet Take 1 tablet by mouth daily.   ZEPBOUND  5 MG/0.5ML Pen SMARTSIG:5 Milligram(s) SUB-Q Once a Week   [DISCONTINUED] venlafaxine  XR (EFFEXOR -XR) 150 MG 24 hr capsule TAKE 1 CAPSULE BY MOUTH DAILY WITH BREAKFAST. (STOP PRISTIQ )   [DISCONTINUED] traZODone  (DESYREL ) 50 MG tablet TAKE 1/2 TO 1 TABLET BY MOUTH AT BEDTIME AS NEEDED SLEEP (Patient not taking: Reported on 01/09/2024)   No facility-administered medications prior to visit.   Reviewed past medical and social history.    ROS per HPI above      Objective:  BP 104/78   Pulse 85   Temp 97.9 F (36.6 C)   Ht 5' 1.5 (1.562 m)   Wt 231 lb 3.2 oz (104.9 kg)   SpO2 98%   BMI 42.98 kg/m      Physical Exam  No results found for any visits on 01/09/24.    Assessment & Plan:    Problem List Items Addressed This Visit     Depression, recurrent (HCC)   Stable mood with effexor  Denies need for trazodone       Relevant Medications   venlafaxine  XR (EFFEXOR -XR) 150 MG 24 hr capsule   GAD (generalized anxiety disorder) - Primary   Stable mood with effexor  Denies need for trazodone       Relevant Medications   venlafaxine  XR (EFFEXOR -XR) 150 MG 24 hr capsule   Mixed hyperlipidemia   Improved per labs completed 07/2023 by [facility]: TC 187, LDL 118. Encouraged to continue heart healthy diet and daily exercise Current use of zepbound       Obesity, morbid (HCC)   Under the care of [facility]-sees a provider and nutritionist every 6weeks Zepbound  prescribed Wt Readings from Last 3 Encounters:  01/09/24 231 lb 3.2 oz (104.9 kg)  06/01/23 240 lb 12.8 oz (109.2 kg)  01/16/23 244 lb 12.8 oz (111 kg)         Relevant Medications   ZEPBOUND  5 MG/0.5ML Pen   Uncontrolled daytime somnolence   Improved sleep initiation with magnesium glycinate 400mg  at hs, but persistent fatigue, restless sleep and daytime somnolence. Need for nap after large meal at lunch time or long car rides. Easily falls asleep if sitting at a stoplight or reading a book or watching TV or in a middle of a conversation easily if she has not had a nap that day. Need for mid day nap 1-2hrs Neck circumference 15.8inch Waist circumference 49.6inch No hx of HYPERTENSION Wt Readings from Last 3 Encounters:  01/09/24 231 lb 3.2 oz (104.9 kg)  06/01/23 240 lb 12.8 oz (109.2 kg)  01/16/23 244 lb 12.8 oz (111 kg)    BP Readings from Last 3 Encounters:  01/09/24 104/78  06/01/23 111/67  01/16/23 114/70    Entered  referral to sleep specialist      Relevant Orders   Ambulatory referral to Sleep Studies   Vitamin D  deficiency   Improved to 37 per labs completed in 07/2023 and [facility]. Advised to continue OVER THE COUNTER dose      Return in about 6 months (around 07/08/2024) for CPE (fasting).     Roselie Mood, NP

## 2024-01-09 NOTE — Assessment & Plan Note (Signed)
 Improved sleep initiation with magnesium glycinate 400mg  at hs, but persistent fatigue, restless sleep and daytime somnolence. Need for nap after large meal at lunch time or long car rides. Easily falls asleep if sitting at a stoplight or reading a book or watching TV or in a middle of a conversation easily if she has not had a nap that day. Need for mid day nap 1-2hrs Neck circumference 15.8inch Waist circumference 49.6inch No hx of HYPERTENSION Wt Readings from Last 3 Encounters:  01/09/24 231 lb 3.2 oz (104.9 kg)  06/01/23 240 lb 12.8 oz (109.2 kg)  01/16/23 244 lb 12.8 oz (111 kg)    BP Readings from Last 3 Encounters:  01/09/24 104/78  06/01/23 111/67  01/16/23 114/70    Entered referral to sleep specialist

## 2024-01-23 DIAGNOSIS — F4323 Adjustment disorder with mixed anxiety and depressed mood: Secondary | ICD-10-CM | POA: Diagnosis not present

## 2024-02-12 ENCOUNTER — Ambulatory Visit: Admitting: Neurology

## 2024-02-12 ENCOUNTER — Encounter: Payer: Self-pay | Admitting: Neurology

## 2024-02-12 VITALS — BP 120/80 | HR 85 | Ht 61.0 in | Wt 230.6 lb

## 2024-02-12 DIAGNOSIS — G4719 Other hypersomnia: Secondary | ICD-10-CM | POA: Diagnosis not present

## 2024-02-12 DIAGNOSIS — Z9189 Other specified personal risk factors, not elsewhere classified: Secondary | ICD-10-CM

## 2024-02-12 DIAGNOSIS — R0683 Snoring: Secondary | ICD-10-CM | POA: Diagnosis not present

## 2024-02-12 DIAGNOSIS — R351 Nocturia: Secondary | ICD-10-CM

## 2024-02-12 NOTE — Progress Notes (Signed)
 Subjective:    Patient ID: Leslie Bell is a 33 y.o. female.  HPI    True Mar, MD, PhD [facility] [address] [address] [place], [place] [address]  Dear Roselie,  I saw your patient, Leslie Bell, upon your kind request in my sleep clinic today for initial consultation of her sleep disorder, in particular, concern for underlying obstructive sleep apnea.  The patient is unaccompanied today.  As you know, [name] is a 33 year old female with an underlying medical history of hyperlipidemia, vitamin D  deficiency, PCOS, depression, anxiety, and severe obesity with a BMI of over 40, who reports snoring and excessive daytime somnolence.  Her Epworth sleepiness score is 13 out of 24, fatigue severity score is 62 out of 63.  I reviewed your office note from 01/09/2024.  Her daytime somnolence and fatigue have been increasing for the past few months.  She has had some trouble falling asleep and tried trazodone  in the past but did not want to continue with it although it did help.  She tried melatonin but it caused her to have nightmares and vivid dreams.  She denies any vivid dreams typically although she does have some confusing dreams at times, no nightmares typically.  Denies any episodes of falling asleep at the wheel but has become sleepy during sessions.  She is a Control and instrumentation engineer.  She has a full-time job and also a part-time job.  She goes to bed generally between 10 and 11 and rise time is currently around 8:30 AM.  She does not drink caffeine daily.  She is a non-smoker, she drinks alcohol rarely, once or twice a week perhaps.  She is single, no children, has 1 dog in the household.  While she does have a TV in the bedroom, it is rarely on, sometimes she watches something on her phone however.  She is not aware of any family history of sleep apnea.  She has been on Zepbound  for the past 6+ months and thus far since earlier this year  has lost about 25 pounds.  She is working on weight loss.  She has had intermittent depression, currently on a stable dose of Effexor  long-acting for the past few years.  She does not sleep well or through the night.  She denies recurrent nocturnal or morning headaches.  She has nocturia about once or twice per average night.  Her Past Medical History Is Significant For: Past Medical History:  Diagnosis Date   Depression    PCOS (polycystic ovarian syndrome)     Her Past Surgical History Is Significant For: History reviewed. No pertinent surgical history.  Her Family History Is Significant For: Family History  Problem Relation Age of Onset   Hypertension Mother    Snoring Mother    Hypertension Father    Alcohol abuse Father    Diabetes Maternal Grandmother    Kidney disease Maternal Grandmother    Stroke Maternal Grandmother    Stroke Maternal Grandfather    Drug abuse Paternal Grandmother    Heart disease Paternal Grandmother    Cancer Paternal Grandfather 42       unknown   Sleep apnea Neg Hx     Her Social History Is Significant For: Social History   Socioeconomic History   Marital status: Single    Spouse name: Not on file   Number of children: 0   Years of education: Not on file   Highest education level: Not on file  Occupational History    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  Tobacco Use   Smoking status: Never   Smokeless tobacco: Never  Vaping Use   Vaping status: Never Used  Substance and Sexual Activity   Alcohol use: Yes    Alcohol/week: 1.0 standard drink of alcohol    Types: 1 Glasses of wine per week    Comment: once a week   Drug use: Never   Sexual activity: Yes    Birth control/protection: Condom  Other Topics Concern   Not on file  Social History Narrative   Rarely has 1 cup of caffeinated tea mostly decaf    Social Drivers of Corporate investment banker Strain: Not on file  Food Insecurity: Not on file  Transportation Needs: Not on file   Physical Activity: Not on file  Stress: Not on file  Social Connections: Not on file    Her Allergies Are:  No Known Allergies:   Her Current Medications Are:  Outpatient Encounter Medications as of 02/12/2024  Medication Sig   Cholecalciferol (VITAMIN D3) 125 MCG (5000 UT) CAPS Take 1 capsule (5,000 Units total) by mouth daily.   Magnesium Glycinate 100 MG CAPS Take 400 mg by mouth at bedtime.   metFORMIN  (GLUCOPHAGE -XR) 500 MG 24 hr tablet Take 1 tablet (500 mg total) by mouth daily with breakfast.   norgestimate-ethinyl estradiol (ORTHO-CYCLEN) 0.25-35 MG-MCG tablet Take 1 tablet by mouth daily.   venlafaxine  XR (EFFEXOR -XR) 150 MG 24 hr capsule Take 1 capsule (150 mg total) by mouth daily with breakfast.   ZEPBOUND  5 MG/0.5ML Pen SMARTSIG:5 Milligram(s) SUB-Q Once a Week   No facility-administered encounter medications on file as of 02/12/2024.  :   Review of Systems:  Out of a complete 14 point review of systems, all are reviewed and negative with the exception of these symptoms as listed below:   Review of Systems  Neurological:        Patient is here alone for sleep consult - some snoring, and has daytime fatigue takes naps daily.  FSS- 62 ESS- 13    Objective:  Neurological Exam  Physical Exam Physical Examination:   Vitals:   02/12/24 1245  BP: 120/80  Pulse: 85  SpO2: 99%    General Examination: The patient is a very pleasant 33 y.o. female in no acute distress. She appears well-developed and well-nourished and well groomed.   HEENT: Normocephalic, atraumatic, pupils are equal, round and reactive to light, extraocular tracking is good without limitation to gaze excursion or nystagmus noted. no photophobia.  Hearing is grossly intact.  Face is symmetric with normal facial animation. Speech is clear without dysarthria. There is no hypophonia. There is no lip, neck/head, jaw or voice tremor. Neck is supple with full range of passive and active motion. There  are no carotid bruits on auscultation.  Airway/Oropharynx exam reveals: mild mouth dryness, good dental hygiene and mild airway crowding, due to small airway entry and tonsillar size of about 1+ bilaterally, wider tongue noted.  Mallampati class II, neck circumference 16 inches, mild to moderate overbite noted.  Tongue protrudes centrally and palate elevates symmetrically.  Chest: Clear to auscultation without wheezing, rhonchi or crackles noted.  Heart: S1+S2+0, regular and normal without murmurs, rubs or gallops noted.   Abdomen: Soft, non-tender and non-distended.  Extremities: There is no pitting edema in the distal lower extremities bilaterally.   Skin: Warm and dry without trophic changes noted.   Musculoskeletal: exam reveals no obvious joint deformities.  Neurologically:  Mental status: The patient is awake, alert and oriented in all 4 spheres. Her immediate and remote memory, attention, language skills and fund of knowledge are appropriate. There is no evidence of aphasia, agnosia, apraxia or anomia. Speech is clear with normal prosody and enunciation. Thought process is linear. Mood is normal and affect is normal.  Cranial nerves II - XII are as described above under HEENT exam.  Motor exam: Normal bulk, strength and tone is noted. There is no obvious action or resting tremor.  Fine motor skills and coordination: Intact grossly.  Cerebellar testing: No dysmetria or intention tremor. There is no truncal or gait ataxia.  Sensory exam: intact to light touch in the upper and lower extremities.  Gait, station and balance: She stands easily. No veering to one side is noted. No leaning to one side is noted. Posture is age-appropriate and stance is narrow based. Gait shows normal stride length and normal pace. No problems turning are noted.   Assessment and Plan:   In summary, Anayia Eugene is a very pleasant 33 y.o.-year old female with an underlying medical history of hyperlipidemia,  vitamin D  deficiency, PCOS, depression, anxiety, and severe obesity with a BMI of over 40, whose history and physical exam are concerning for sleep disordered breathing, particularly obstructive sleep apnea (OSA). A laboratory attended sleep study is typically considered gold standard for evaluation of sleep disordered breathing.   I had a long chat with the patient about my findings and the diagnosis of sleep apnea, particularly OSA, its prognosis and treatment options. We talked about medical/conservative treatments, surgical interventions and non-pharmacological approaches for symptom control. I explained, in particular, the risks and ramifications of untreated moderate to severe OSA, especially with respect to developing cardiovascular disease down the road, including congestive heart failure (CHF), difficult to treat hypertension, cardiac arrhythmias (particularly A-fib), neurovascular complications including TIA, stroke and dementia. Even type 2 diabetes has, in part, been linked to untreated OSA. Symptoms of untreated OSA may include (but may not be limited to) daytime sleepiness, nocturia (i.e. frequent nighttime urination), memory problems, mood irritability and suboptimally controlled or worsening mood disorder such as depression and/or anxiety, lack of energy, lack of motivation, physical discomfort, as well as recurrent headaches, especially morning or nocturnal headaches. We talked about the importance of maintaining a healthy lifestyle and striving for healthy weight. In addition, we talked about the importance of striving for and maintaining good sleep hygiene. I recommended a sleep study at this time. I outlined the differences between a laboratory attended sleep study which is considered more comprehensive and accurate over the option of a home sleep test (HST); the latter may lead to underestimation of sleep disordered breathing in some instances and does not help with diagnosing upper airway  resistance syndrome and is not accurate enough to diagnose primary central sleep apnea typically. I outlined possible surgical and non-surgical treatment options of OSA, including the use of a positive airway pressure (PAP) device (i.e. CPAP, AutoPAP/APAP or BiPAP in certain circumstances), a custom-made dental device (aka oral appliance, which would require a referral to a specialist dentist or orthodontist typically, and is generally speaking not considered for patients with full dentures or edentulous state), upper airway surgical options, such as traditional UPPP (which is not considered a first-line treatment) or the Inspire device (hypoglossal nerve stimulator, which would involve a referral for consultation with an ENT surgeon, after careful selection, following inclusion criteria - also not first-line treatment). I explained the PAP treatment  option to the patient in detail, as this is generally considered first-line treatment.  The patient indicated that she would be willing to try PAP therapy, if the need arises. I explained the importance of being compliant with PAP treatment, not only for insurance purposes but primarily to improve patient's symptoms symptoms, and for the patient's long term health benefit, including to reduce Her cardiovascular risks longer-term.    We will pick up our discussion about the next steps and treatment options after testing.  We will keep her posted as to the test results by phone call and/or MyChart messaging where possible.  We will plan to follow-up in sleep clinic accordingly as well.  I answered all her questions today and the patient was in agreement.   I encouraged her to call with any interim questions, concerns, problems or updates or email us  through MyChart.  Generally speaking, sleep test authorizations may take up to 2 weeks, sometimes less, sometimes longer, the patient is encouraged to get in touch with us  if they do not hear back from the sleep lab staff  directly within the next 2 weeks.  Thank you very much for allowing me to participate in the care of this nice patient. If I can be of any further assistance to you please do not hesitate to call me at [phone].  Sincerely,   True Mar, MD, PhD

## 2024-02-12 NOTE — Patient Instructions (Signed)

## 2024-02-13 DIAGNOSIS — F4323 Adjustment disorder with mixed anxiety and depressed mood: Secondary | ICD-10-CM | POA: Diagnosis not present

## 2024-03-12 DIAGNOSIS — F4323 Adjustment disorder with mixed anxiety and depressed mood: Secondary | ICD-10-CM | POA: Diagnosis not present

## 2024-03-22 ENCOUNTER — Encounter

## 2024-03-22 DIAGNOSIS — Z9189 Other specified personal risk factors, not elsewhere classified: Secondary | ICD-10-CM

## 2024-03-22 DIAGNOSIS — R0683 Snoring: Secondary | ICD-10-CM

## 2024-03-22 DIAGNOSIS — G4719 Other hypersomnia: Secondary | ICD-10-CM

## 2024-03-26 DIAGNOSIS — F4323 Adjustment disorder with mixed anxiety and depressed mood: Secondary | ICD-10-CM | POA: Diagnosis not present

## 2024-04-08 ENCOUNTER — Ambulatory Visit: Admitting: Nurse Practitioner

## 2024-04-08 ENCOUNTER — Encounter: Payer: Self-pay | Admitting: Nurse Practitioner

## 2024-04-08 VITALS — BP 124/78 | HR 100 | Temp 98.6°F | Ht 61.0 in | Wt 226.6 lb

## 2024-04-08 DIAGNOSIS — R7303 Prediabetes: Secondary | ICD-10-CM | POA: Diagnosis not present

## 2024-04-08 DIAGNOSIS — J029 Acute pharyngitis, unspecified: Secondary | ICD-10-CM

## 2024-04-08 DIAGNOSIS — E782 Mixed hyperlipidemia: Secondary | ICD-10-CM | POA: Diagnosis not present

## 2024-04-08 DIAGNOSIS — E559 Vitamin D deficiency, unspecified: Secondary | ICD-10-CM

## 2024-04-08 DIAGNOSIS — E282 Polycystic ovarian syndrome: Secondary | ICD-10-CM

## 2024-04-08 DIAGNOSIS — G9332 Myalgic encephalomyelitis/chronic fatigue syndrome: Secondary | ICD-10-CM | POA: Insufficient documentation

## 2024-04-08 MED ORDER — METFORMIN HCL ER 500 MG PO TB24: 500.0000 mg | ORAL_TABLET | Freq: Every day | ORAL | 0 refills | Status: AC

## 2024-04-08 NOTE — Progress Notes (Signed)
 Acute Office Visit  Subjective:    Patient ID: Leslie Bell, female    DOB: 1990/10/09, 33 y.o.   MRN: [id number]  Chief Complaint  Patient presents with   Sore Throat    Cough and SOB has resolved. Sore throat onset yesterday   Sore Throat  This is a new problem. The current episode started yesterday. The problem has been unchanged. There has been no fever. The pain is mild. Associated symptoms include congestion. Pertinent negatives include no abdominal pain, coughing, diarrhea, drooling, ear discharge, ear pain, headaches, hoarse voice, plugged ear sensation, neck pain, shortness of breath, stridor, swollen glands, trouble swallowing or vomiting. She has had no exposure to strep or mono. She has tried nothing for the symptoms.  Onset of sore throat yesterday with post nasal drainage. No known sick contact. No GERD. She declined strep swab today.  Vitamin D  deficiency Labs completed by [facility] in 07/2023. Advised to send copy of results via mychart  Prediabetes Labs completed by [facility] in 07/2023. Advised to send copy of results via mychart  Mixed hyperlipidemia Labs completed by [facility] in 07/2023. Advised to send copy of results via mychart   Wt Readings from Last 3 Encounters:  04/08/24 226 lb 9.6 oz (102.8 kg)  02/12/24 230 lb 9.6 oz (104.6 kg)  01/09/24 231 lb 3.2 oz (104.9 kg)    Outpatient Medications Prior to Visit  Medication Sig   Cholecalciferol (VITAMIN D3) 125 MCG (5000 UT) CAPS Take 1 capsule (5,000 Units total) by mouth daily.   Magnesium Glycinate 100 MG CAPS Take 400 mg by mouth at bedtime.   norgestimate-ethinyl estradiol (ORTHO-CYCLEN) 0.25-35 MG-MCG tablet Take 1 tablet by mouth daily.   tirzepatide  (ZEPBOUND ) 10 MG/0.5ML Pen Inject 10 mg into the skin once a week.   venlafaxine  XR (EFFEXOR -XR) 150 MG 24 hr capsule Take 1 capsule (150 mg total) by mouth daily with breakfast.   [DISCONTINUED] metFORMIN  (GLUCOPHAGE -XR) 500 MG 24 hr tablet Take 1 tablet  (500 mg total) by mouth daily with breakfast.   [DISCONTINUED] ZEPBOUND  5 MG/0.5ML Pen SMARTSIG:5 Milligram(s) SUB-Q Once a Week (Patient not taking: Reported on 04/08/2024)   No facility-administered medications prior to visit.   Reviewed past medical and social history.  Review of Systems  HENT:  Positive for congestion. Negative for drooling, ear discharge, ear pain, hoarse voice and trouble swallowing.   Respiratory:  Negative for cough, shortness of breath and stridor.   Gastrointestinal:  Negative for abdominal pain, diarrhea and vomiting.  Musculoskeletal:  Negative for neck pain.  Neurological:  Negative for headaches.   Per HPI      Objective:    Physical Exam Vitals and nursing note reviewed.  HENT:     Right Ear: Tympanic membrane, ear canal and external ear normal.     Left Ear: Tympanic membrane, ear canal and external ear normal.     Nose: Congestion present.     Mouth/Throat:     Pharynx: Posterior oropharyngeal erythema present. No oropharyngeal exudate.  Eyes:     Extraocular Movements: Extraocular movements intact.     Conjunctiva/sclera: Conjunctivae normal.  Cardiovascular:     Rate and Rhythm: Normal rate and regular rhythm.     Pulses: Normal pulses.     Heart sounds: Normal heart sounds.  Pulmonary:     Effort: Pulmonary effort is normal.     Breath sounds: Normal breath sounds.  Lymphadenopathy:     Cervical: No cervical adenopathy.  Neurological:  Mental Status: She is alert and oriented to person, place, and time.  Psychiatric:        Mood and Affect: Mood normal.        Behavior: Behavior normal.        Thought Content: Thought content normal.    BP 124/78 (BP Location: Left Arm, Patient Position: Sitting, Cuff Size: Large)   Pulse 100   Temp 98.6 F (37 C) (Oral)   Ht 5' 1 (1.549 m)   Wt 226 lb 9.6 oz (102.8 kg)   LMP 04/02/2024   SpO2 97%   BMI 42.82 kg/m    No results found for any visits on 04/08/24.     Assessment & Plan:    Problem List Items Addressed This Visit     Mixed hyperlipidemia   Labs completed by [facility] in 07/2023. Advised to send copy of results via mychart      Prediabetes   Labs completed by [facility] in 07/2023. Advised to send copy of results via mychart      Relevant Medications   metFORMIN  (GLUCOPHAGE -XR) 500 MG 24 hr tablet   Vitamin D  deficiency   Labs completed by [facility] in 07/2023. Advised to send copy of results via mychart      Other Visit Diagnoses       Acute pharyngitis, unspecified etiology    -  Primary     Polycystic ovaries       Relevant Medications   metFORMIN  (GLUCOPHAGE -XR) 500 MG 24 hr tablet      Meds ordered this encounter  Medications   metFORMIN  (GLUCOPHAGE -XR) 500 MG 24 hr tablet    Sig: Take 1 tablet (500 mg total) by mouth daily with breakfast.    Dispense:  90 tablet    Refill:  0    Supervising Provider:   BERNETA ELSIE SAYRE [[id number]]   Return in about 6 months (around 10/07/2024) for CPE (fasting).    Roselie Mood, NP

## 2024-04-08 NOTE — Assessment & Plan Note (Signed)
 Labs completed by [facility] in 07/2023. Advised to send copy of results via mychart

## 2024-04-08 NOTE — Patient Instructions (Addendum)
 Start zyrtec  10mg  daily at bedtime to help with post nasal drainage. Use warm salt water gaggle or lozenges to help with sore throat. Maintain adequate oral hydration. Send March lab results via mychart as soon as possible

## 2024-04-09 DIAGNOSIS — F4323 Adjustment disorder with mixed anxiety and depressed mood: Secondary | ICD-10-CM | POA: Diagnosis not present

## 2024-05-30 ENCOUNTER — Telehealth: Payer: Self-pay

## 2024-05-30 NOTE — Telephone Encounter (Signed)
 Patient has attempted 2 HST's (and failed both). Message received from [facility]:  Both studies inconclusive due to low perfusion and noise/motion artifact, resulting in >90% SPO2 exclusions (attempted on 11/30 and 1/14)   Would you like for patient to complete an inlab sleep study now? Please place a new order if so. Thank you!

## 2024-05-30 NOTE — Telephone Encounter (Signed)
 [facility] ordered d/t 2 failed HSTs.

## 2024-05-30 NOTE — Addendum Note (Signed)
 Addended by: Doaa Kendzierski on: 05/30/2024 04:35 PM   Modules accepted: Orders
